# Patient Record
Sex: Female | Born: 1995 | Hispanic: No | Marital: Single | Smoking: Never smoker
Health system: Southern US, Community
[De-identification: ages and names within clinical notes are randomized; demographics above are authoritative.]

## PROBLEM LIST (undated history)

## (undated) DIAGNOSIS — Z8481 Family history of carrier of genetic disease: Secondary | ICD-10-CM

## (undated) DIAGNOSIS — Z8041 Family history of malignant neoplasm of ovary: Secondary | ICD-10-CM

## (undated) HISTORY — DX: Family history of malignant neoplasm of ovary: Z80.41

## (undated) HISTORY — DX: Family history of carrier of genetic disease: Z84.81

---
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
## 2014-02-05 ENCOUNTER — Other Ambulatory Visit: Payer: Self-pay

## 2015-11-29 ENCOUNTER — Ambulatory Visit: Payer: Medicaid Other

## 2015-12-09 ENCOUNTER — Ambulatory Visit: Payer: Self-pay | Attending: Internal Medicine

## 2020-04-21 ENCOUNTER — Other Ambulatory Visit: Payer: Self-pay

## 2020-04-21 ENCOUNTER — Other Ambulatory Visit (HOSPITAL_COMMUNITY)
Admission: RE | Admit: 2020-04-21 | Discharge: 2020-04-21 | Disposition: A | Discharge status: Home / Self Care | Payer: Medicaid Other | Source: Ambulatory Visit | Attending: Family Medicine | Admitting: Family Medicine

## 2020-04-21 ENCOUNTER — Encounter: Payer: Self-pay | Admitting: Family Medicine

## 2020-04-21 ENCOUNTER — Ambulatory Visit (INDEPENDENT_AMBULATORY_CARE_PROVIDER_SITE_OTHER): Payer: Self-pay | Admitting: Family Medicine

## 2020-04-21 VITALS — BP 104/70 | HR 97 | Ht 63.0 in | Wt 209.5 lb

## 2020-04-21 DIAGNOSIS — Z113 Encounter for screening for infections with a predominantly sexual mode of transmission: Secondary | ICD-10-CM | POA: Insufficient documentation

## 2020-04-21 DIAGNOSIS — Z114 Encounter for screening for human immunodeficiency virus [HIV]: Secondary | ICD-10-CM

## 2020-04-21 DIAGNOSIS — J309 Allergic rhinitis, unspecified: Secondary | ICD-10-CM | POA: Insufficient documentation

## 2020-04-21 DIAGNOSIS — Z1159 Encounter for screening for other viral diseases: Secondary | ICD-10-CM

## 2020-04-21 DIAGNOSIS — Z0000 Encounter for general adult medical examination without abnormal findings: Secondary | ICD-10-CM

## 2020-04-21 DIAGNOSIS — E669 Obesity, unspecified: Secondary | ICD-10-CM

## 2020-04-21 DIAGNOSIS — Z124 Encounter for screening for malignant neoplasm of cervix: Secondary | ICD-10-CM

## 2020-04-21 DIAGNOSIS — J3089 Other allergic rhinitis: Secondary | ICD-10-CM

## 2020-04-21 DIAGNOSIS — R41840 Attention and concentration deficit: Secondary | ICD-10-CM

## 2020-04-21 DIAGNOSIS — R946 Abnormal results of thyroid function studies: Secondary | ICD-10-CM

## 2020-04-21 HISTORY — DX: Allergic rhinitis, unspecified: J30.9

## 2020-04-21 LAB — POCT GLYCOSYLATED HEMOGLOBIN (HGB A1C): Hemoglobin A1C: 5.3 % (ref 4.0–5.6)

## 2020-04-21 MED ORDER — FLUTICASONE PROPIONATE 50 MCG/ACT NA SUSP: 2.0000 | Freq: Every day | NASAL | 6 refills | Status: DC

## 2020-04-21 NOTE — Progress Notes (Addendum)
CPT E&M Office Visit Time Before Visit; reviewing medical records (e.g. recent visits, labs, studies): 10 minutes During Visit (F2F time): 20 minutes After Visit (discussion with family or HCP, prescribing, ordering, referring, calling result/recommendations or documenting on same day): 15 minutes Total Visit Time: 45 minutes  Beverly Hunter is appeared alone Sources of clinical information for visit is/are patient and past medical records and labs. Nursing assessment for this office visit was reviewed with the patient for accuracy and revision.   Birth sex: Female Gender self-identification: Woman. Prefers She/Her pronouns. Bisexual orientation. Not currently in a sexual relationship. Not interested in contraception.   Previous Report(s) Reviewed: historical medical records  Depression screen [facility] 2/9 04/21/2020  Decreased Interest 1  Down, Depressed, Hopeless 1  PHQ - 2 Score 2  Altered sleeping 2  Tired, decreased energy 2  Change in appetite 3  Feeling bad or failure about yourself  1  Trouble concentrating 3  Moving slowly or fidgety/restless 1  Suicidal thoughts 0  PHQ-9 Score 14    No flowsheet data found.  PHQ9 SCORE ONLY 04/21/2020  PHQ-9 Total Score 14    Adult vaccines due  Topic Date Due  . TETANUS/TDAP  10/18/2020    Health Maintenance Due  Topic Date Due  . PAP-Cervical Cytology Screening  Never done  . PAP SMEAR-Modifier  Never done      History/P.E. limitations: none  Adult vaccines due  Topic Date Due  . TETANUS/TDAP  10/18/2020   There are no preventive care reminders to display for this patient.  Health Maintenance Due  Topic Date Due  . PAP-Cervical Cytology Screening  Never done  . PAP SMEAR-Modifier  Never done     Chief Complaint  Patient presents with  . Annual Exam  . ADHD  . Obesity  . Allergic Rhinitis     Re-establish care.  Last [facility] office visit in 2015, when she was a patient of Dr Arieon Corcoran.Sherrill's mother is a patient  of Dr Quaid Yeakle.   Visit Problem List with A/P  Poor concentration New problem Ms Marsico reports difficulty with completing tasks.  This difficulty may be related to problems in organizing tasks, attention to tasks, or remembering the tasks.  This problem is interfering with her occupational life.  Her only restless symptom is interrupting others during conversations.  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 Ms Sportsman feels that she had similar problems during college with her academic tasks.   Ms Shannon' PHQ-9 score was 14, with question nine with a non SI answer.  No report of substance abuse disorder.   Assessment 1) Complaint of attention/concentration difficulties that are interfering with occupational performance, and previously with academic performance.  While this could be Adult Attention Deficit Disorder, the elevated PHQ-9 could indicate either a primary mood difficulty or a co-morbid mood problem along with an attention disorder.  Given the complexity of the diagnosis, I recommend a referral to psychiatry for evaluation and treatment recommendations.  Ms Longsworth' is in agreement with this recommendation.   I ask that Ms Nawaz contact her health insurer to see which psychiatrist or psychiatric nurse practioners ar in her insurers' provider network, then arrange an appointment with one of them.  We will be glad to make a formal referral if it is required by her insurer.   Additionally, I recommend to Ms Meldrum to review the psychiatry providers registered  on the [employer] Today "Find a Therapist" database. She may want to look into to whom she would like to consult.   Finally, I sent thru MyChart the Adult ADHD Self-Report Scale, asking her to complete and return it to me.  We can use this in making a referral.     Obesity (BMI 35.0-39.9 without  comorbidity) Established problem worsened. Wt Readings from Last 3 Encounters:  04/21/20 209 lb 8 oz (95 kg)  04/27/13 161 lb (73 kg) (90 %, Z= 1.28)*  04/01/13 166 lb (75.3 kg) (92 %, Z= 1.40)*   Ms Cuthrell's BMI is 37% which is consistent with obesity.  She currently has a low activity level.  Her eating habits sound sporadic.  She wanted to know about pharmacological options for weight reduction.  We discussed the Over-the -Counter Alli (orlistat) in addition to healthy eating.  She is interested in nutrition counseling, so I gave her Dr Rhona Raider contact card, asking her to call Dr Gerilyn Pilgrim to discuss setting up a consultation.    Looking for possible cause and complications of obesity  Lab Results  Component Value Date   TSH 2.050 04/21/2020   Lab Results  Component Value Date   HGBA1C 5.3 04/21/2020   No evidence of thyroid dysfunction nor diabetes.      Allergic rhinitis Established problem Perennial allgery - worse with dust exposure Uncontrolled - disturbing post-nasal drip Start: Flonase Continue: OTC daily non-sedating antihistamines Request Ms Tuckett notify us if her symptom is not improving, or the therapy is not tolerable.    Healthcare maintenance GC/chlamydia urine screening done Screening HIV and HCV performed in low risk individual  Flu vacc given Ms Caver would prefer a female provider to perform her delinquent Pap smear and HPV testing. A referral was placed to [facility] for cervical cancer screening.

## 2020-04-21 NOTE — Patient Instructions (Addendum)
Consider calling Wyona Almas, PhD  RD for nutritional counseling.  If you wish to use an Alli (orlistat) a medication that blocks absorption of fat in your food.  It is available at most Drug Stores, Teacher, English as a foreign language. Take a Multi-Vitamin at Bedtime if you use Alli to make sure you get enough fat-soluble vitamins absorbed.   We are checking thyroid function, hepatitis C virus and HIV screening.  A one-time screening for these infections are universally recommended by the CDC. We are screening for gonorrhea and chlamydia with a urine test.  This screening is also recommended by the CDC in women age 54 to 41.   We will make a referral to the [facility] over near the old [facility] to have your Pap smear performed.   Once Dr Tenelle Andreason figures out which psychiatrist accept your insurance, we will make a referral to them for you.   Give a try of  Flonase nasal spray for at least a month to see if it helps.  If it helps, it is safe to take indefinitely.

## 2020-04-22 DIAGNOSIS — Z0000 Encounter for general adult medical examination without abnormal findings: Secondary | ICD-10-CM | POA: Insufficient documentation

## 2020-04-22 DIAGNOSIS — R41840 Attention and concentration deficit: Secondary | ICD-10-CM

## 2020-04-22 HISTORY — DX: Attention and concentration deficit: R41.840

## 2020-04-22 LAB — HEPATITIS C ANTIBODY: Hep C Virus Ab: <0.1 s/co ratio (ref 0.0–0.9)

## 2020-04-22 LAB — TSH: TSH: 2.05 u[IU]/mL (ref 0.450–4.500)

## 2020-04-22 LAB — HIV ANTIBODY (ROUTINE TESTING W REFLEX): HIV Screen 4th Generation wRfx: NONREACTIVE

## 2020-04-22 NOTE — Assessment & Plan Note (Addendum)
GC/chlamydia urine screening done Screening HIV and HCV performed in low risk individual  Flu vacc given Beverly Hunter Date would prefer a female provider to perform her delinquent Pap smear and HPV testing. A referral was placed to [facility] for [facility] for cervical cancer screening.

## 2020-04-22 NOTE — Assessment & Plan Note (Addendum)
New problem Ms Beverly Hunter reports difficulty with completing tasks.  This difficulty may be related to problems in organizing tasks, attention to tasks, or remembering the tasks.  This problem is interfering with her occupational life.  Her only restless symptom is interrupting others during conversations.  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 Ms Beverly Hunter feels that she had similar problems during college with her academic tasks.   Ms Beverly Hunter' PHQ-9 score was 14, with question nine with a non SI answer.  No report of substance abuse disorder.   Assessment 1) Complaint of attention/concentration difficulties that are interfering with occupational performance, and previously with academic performance.  While this could be Adult Attention Deficit Disorder, the elevated PHQ-9 could indicate either a primary mood difficulty or a co-morbid mood problem along with an attention disorder.  Given the complexity of the diagnosis, I recommend a referral to psychiatry for evaluation and treatment recommendations.  Ms Beverly Hunter' is in agreement with this recommendation.   I ask that Ms Beverly Hunter contact her health insurer to see which psychiatrist or psychiatric nurse practioners ar in her insurers' provider network, then arrange an appointment with one of them.  We will be glad to make a formal referral if it is required by her insurer.   Additionally, I recommend to Ms Beverly Hunter to review the psychiatry providers registered on the [employer] Today "Find a Therapist" database. She may want to look into to whom she would like to consult.   Finally, I sent thru MyChart the Adult ADHD Self-Report Scale, asking her to complete and return it to me.  We can use this in making a referral.

## 2020-04-22 NOTE — Addendum Note (Signed)
Addended byPerley Jain, Grover Robinson D on: 04/22/2020 12:24 PM   Modules accepted: Orders

## 2020-04-22 NOTE — Assessment & Plan Note (Addendum)
Established problem worsened. Wt Readings from Last 3 Encounters:  04/21/20 209 lb 8 oz (95 kg)  04/27/13 161 lb (73 kg) (90 %, Z= 1.28)*  04/01/13 166 lb (75.3 kg) (92 %, Z= 1.40)*   Beverly Hunter's BMI is 37% which is consistent with obesity.  She currently has a low activity level.  Her eating habits sound sporadic.  She wanted to know about pharmacological options for weight reduction.  We discussed the Over-the -Counter Alli (orlistat) in addition to healthy eating.  She is interested in nutrition counseling, so I gave her Dr Rhona Raider contact card, asking her to call Dr Gerilyn Pilgrim to discuss setting up a consultation.    Looking for possible cause and complications of obesity  Lab Results  Component Value Date   TSH 2.050 04/21/2020   Lab Results  Component Value Date   HGBA1C 5.3 04/21/2020   No evidence of thyroid dysfunction nor diabetes.

## 2020-04-22 NOTE — Assessment & Plan Note (Addendum)
Established problem Perennial allgery - worse with dust exposure Uncontrolled - disturbing post-nasal drip Start: Flonase Continue: OTC daily non-sedating antihistamines Request Ms Hasley notify us if her symptom is not improving, or the therapy is not tolerable.

## 2020-04-25 LAB — URINE CYTOLOGY ANCILLARY ONLY
Chlamydia: NEGATIVE
Comment: NEGATIVE
Comment: NORMAL
Neisseria Gonorrhea: NEGATIVE

## 2020-05-02 ENCOUNTER — Encounter: Payer: Self-pay | Admitting: Family Medicine

## 2020-06-08 ENCOUNTER — Encounter: Payer: Self-pay | Admitting: Nurse Practitioner

## 2020-06-08 ENCOUNTER — Ambulatory Visit (INDEPENDENT_AMBULATORY_CARE_PROVIDER_SITE_OTHER): Payer: Medicaid Other | Admitting: Nurse Practitioner

## 2020-06-08 ENCOUNTER — Other Ambulatory Visit (HOSPITAL_COMMUNITY)
Admission: RE | Admit: 2020-06-08 | Discharge: 2020-06-08 | Disposition: A | Discharge status: Home / Self Care | Payer: Medicaid Other | Source: Ambulatory Visit | Attending: Nurse Practitioner | Admitting: Nurse Practitioner

## 2020-06-08 ENCOUNTER — Other Ambulatory Visit: Payer: Self-pay

## 2020-06-08 VITALS — Wt 209.9 lb

## 2020-06-08 DIAGNOSIS — Z01419 Encounter for gynecological examination (general) (routine) without abnormal findings: Secondary | ICD-10-CM

## 2020-06-08 DIAGNOSIS — Z6837 Body mass index (BMI) 37.0-37.9, adult: Secondary | ICD-10-CM

## 2020-06-08 DIAGNOSIS — Z124 Encounter for screening for malignant neoplasm of cervix: Secondary | ICD-10-CM | POA: Diagnosis not present

## 2020-06-08 NOTE — Progress Notes (Signed)
GYNECOLOGY ANNUAL PREVENTATIVE CARE ENCOUNTER NOTE  Subjective:   Beverly Hunter is a 25 y.o. No obstetric history on file. female here for a routine annual gynecologic exam.  Current complaints: wants pap and it will be her first pap smear.   Denies abnormal vaginal bleeding, discharge, pelvic pain, problems with intercourse or other gynecologic concerns. Has PCP she sees for other medical problems but she wanted a female provider for her pap smear.   Gynecologic History Patient's last menstrual period was 06/05/2020 (exact date). Contraception: abstinence Last Pap: NA  Obstetric History OB History  No obstetric history on file.    No past medical history on file.  No past surgical history on file.  Current Outpatient Medications on File Prior to Visit  Medication Sig Dispense Refill  . FLUoxetine (PROZAC) 20 MG tablet Take 20 mg by mouth daily.    . fluticasone (FLONASE) 50 MCG/ACT nasal spray Place 2 sprays into both nostrils daily. 16 g 6  . levocetirizine (XYZAL) 5 MG tablet Take 5 mg by mouth every evening.    . Multiple Vitamin (MULTIVITAMIN WITH MINERALS) TABS tablet Take 1 tablet by mouth daily.    Marland Kitchen orlistat (ALLI) 60 MG capsule Take 60 mg by mouth 3 (three) times daily with meals.     No current facility-administered medications on file prior to visit.    No Known Allergies  Social History   Socioeconomic History  . Marital status: Single    Spouse name: Not on file  . Number of children: Not on file  . Years of education: Not on file  . Highest education level: Not on file  Occupational History  . Not on file  Tobacco Use  . Smoking status: Never Smoker  . Smokeless tobacco: Never Used  Substance and Sexual Activity  . Alcohol use: Never  . Drug use: Never  . Sexual activity: Not Currently  Other Topics Concern  . Not on file  Social History Narrative  . Not on file   Social Determinants of Health   Financial Resource Strain: Not on file   Food Insecurity: Not on file  Transportation Needs: Not on file  Physical Activity: Not on file  Stress: Not on file  Social Connections: Not on file  Intimate Partner Violence: Not on file    No family history on file.  The following portions of the patient's history were reviewed and updated as appropriate: allergies, current medications, past family history, past medical history, past social history, past surgical history and problem list.  Review of Systems Pertinent items noted in HPI and remainder of comprehensive ROS otherwise negative.   Objective:  Wt 209 lb 14.4 oz (95.2 kg)   LMP 06/05/2020 (Exact Date)   BMI 37.18 kg/m  BP not recorded at the time of this note.  Was checked today by CMA and client reports she was told it was normal. CONSTITUTIONAL: Well-developed, well-nourished female in no acute distress.  HENT:  Normocephalic, atraumatic, External right and left ear normal.  EYES: Conjunctivae and EOM are normal. Pupils are equal, round.  No scleral icterus.  NECK: Normal range of motion, supple, no masses.  Normal thyroid.  SKIN: Skin is warm and dry. No rash noted. Not diaphoretic. No erythema. No pallor. NEUROLOGIC: Alert and oriented to person, place, and time. Normal reflexes, muscle tone coordination. No cranial nerve deficit noted. PSYCHIATRIC: Normal mood and affect. Normal behavior. Normal judgment and thought content. CARDIOVASCULAR: Normal heart rate noted, regular rhythm RESPIRATORY:  Clear to auscultation bilaterally. Effort and breath sounds normal, no problems with respiration noted. BREASTS: Symmetric in size. No masses, skin changes, nipple drainage, or lymphadenopathy. ABDOMEN: Soft, no distention noted.  No tenderness, rebound or guarding.  PELVIC: Normal appearing external genitalia; normal appearing vaginal mucosa and cervix.  No abnormal discharge noted.  Pap smear obtained.  Bimanual deferred MUSCULOSKELETAL: Normal range of motion. No  tenderness.  No cyanosis, clubbing, or edema.    Assessment and Plan:  1. Annual GYN exam nonsmoker Advised weight loss to BMI less than 25 Advised to return as needed for contraception  - Cytology - PAP( [facility])  Will follow up results of pap smear and manage accordingly. Routine preventative health maintenance measures emphasized. Please refer to After Visit Summary for other counseling recommendations.    Nolene Bernheim, RN, MSN, NP-BC Nurse Practitioner, [facility]

## 2020-06-09 ENCOUNTER — Ambulatory Visit (INDEPENDENT_AMBULATORY_CARE_PROVIDER_SITE_OTHER): Payer: Medicaid Other | Admitting: Family Medicine

## 2020-06-09 DIAGNOSIS — E669 Obesity, unspecified: Secondary | ICD-10-CM

## 2020-06-09 DIAGNOSIS — Z713 Dietary counseling and surveillance: Secondary | ICD-10-CM

## 2020-06-09 NOTE — Patient Instructions (Addendum)
You may want to experiment with adding seltzer or club soda to sweet drinks.  Over time, you can increase the proportion of seltzer to sweet drink, and taste preferences will change.    Goals:  1. Walk at least 30 minutes 3 times a week.       - Call [facility] to ask if she wants to walk also.      - Determine some safe place to walk.       - Set some consistent times to walk each week.       - Initially, schedule your walk times on your calendar.     - Place your walking shoes by the door the morning of walking days, and pour a water bottle for after your walk.    2. Eat at least 3 REAL meals and 1-2 snacks per day.  Eat breakfast within one hour of getting up.  Aim for no more than 5 hours between eating.   A REAL meal for lunch and dinner includes at least some protein, some starch, and vegetables and/or fruit.   (OR: Would you serve this to a guest in your home, and call it a meal?)      - For both lunch and dinner, aim for twice the volume of vegetables as either protein or starch foods.       - On the weekend, determine what groceries you need for the week, so you have foods on hand to make meals you want.       - Make lunches the night before, which could be just dinner leftovers.       - Make good use of frozen vegetables for a quick option.       - Try roasted vegetables.    Document progress on your goals using a chart you design or a notebook, or just marking on a calendar.      Follow-up video apt on Thursday, March 17 at 1:30 PM.

## 2020-06-09 NOTE — Progress Notes (Signed)
Telehealth Encounter I connected with Beverly Hunter (MRN [id number]) on 06/09/2020 by MyChart video-enabled, HIPAA-compliant telemedicine application, verified that I was speaking with the correct person using two identifiers, and that the patient was in a private environment conducive to confidentiality.  The patient agreed to proceed.  Persons participating in visit were patient and provider (registered dietitian) Linna Darner, PhD, RD, LDN, CEDRD.  Provider was located at [facility] during this telehealth encounter; patient was at home.  Appt start time: 1530 end time: 1630 (1 hour)  Reason for telehealth visit: Referred by Acquanetta Belling, MD for Medical Nutrition Therapy related to weight management.  Relevant history/background: Beverly Hunter said she doesn't eat much, but still gains weight easily.  When she restricted to 1200 kcal/day, she lost weight, but regained during the pandemic once she stopped going to the gym, and was eating more while home.    Assessment: Beverly Hunter lives with her mom with whom she sometimes shares meals.  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   Usual eating pattern: 2 meals and 0-1 snack per day. Frequent foods and beverages: 24-36 oz Sprite/day, few sips fruit punch/day, and tries to drink 16 oz water/day); eggs, toast, bacon, rice and veg's, chx, beef, peanut butter.   Avoided foods: spicy foods, certain textures (e.g., berries, tomatoes), seafood, fish.   Usual physical activity: None currently.  Cancelled gym membership b/c of COVID. Sleep: Estimates average of 6-7 hours of sleep/night.  Feels more energetic since starting fluoxetine this month.   24-hr recall:  (Up at 10 AM; had ~6 oz fruit punch with med's) B (11:30 AM)-  2 scrmbld eggs, 2 toast, 2 tsp margarine, 2 slc bacon, 6 oz fruit punch Snk ( AM)-  --- L ( PM)-  --- Snk ( PM)-   10 oz Hi-C  D (11 PM)-  1 slc chs pizza, 12 oz Sprite Snk ( PM)-  --- Typical day? No.  Rarely has pizza, and usually has fewer sweet drinks.  Dinner is more likely to be rice and veg's with chx or beef or frozen pasta meals.    Intervention: Completed diet and exercise history, and established behavioral goals.   For recommendations and goals, see Patient Instructions.    Follow-up: 4 weeks.   Beverly Hunter,[name]

## 2020-06-13 LAB — CYTOLOGY - PAP: Diagnosis: NEGATIVE

## 2020-07-07 ENCOUNTER — Ambulatory Visit (INDEPENDENT_AMBULATORY_CARE_PROVIDER_SITE_OTHER): Payer: 59 | Admitting: Family Medicine

## 2020-07-07 ENCOUNTER — Other Ambulatory Visit: Payer: Self-pay

## 2020-07-07 DIAGNOSIS — E669 Obesity, unspecified: Secondary | ICD-10-CM | POA: Diagnosis not present

## 2020-07-07 NOTE — Patient Instructions (Signed)
Snacks:  Always plate your food.  This gives you a natural stopping point.   Other snack options:  Unsweetened apple sauce, apples, grapes, oranges, string cheese, peanut butter on apple, any nuts/seeds (limit to <1/2 cup; most nuts/seeds provide 800 calories per cup).  Work on expanding the repertoire of vegetables that you like to help ensure that you don't get tired of vegetables.  Make three lists:  1. Vegetables you like and eat now 2. Vegetables you won't even consider 3. Vegetables you might consider trying if they are prepared a certain way Continue to eat vegetables you currently eat, but from list #3, choose a vegetable to try at least 3 times a week.  Use small amounts of this vegetable, cut small, combined with foods or seasonings you like.    Goals remain the same:  1. Walk at least 30 minutes 3 times a week.      - Consider ways in which you can help ensure your walking happens, e.g., have you scheduled your walking for intended days?  2. Eat at least 3 REAL meals and 1-2 snacks per day.  Eat breakfast within one hour of getting up.  Aim for no more than 5 hours between eating.   A REAL meal for lunch and dinner includes at least some protein, some starch, and vegetables and/or fruit.   (OR: Would you serve this to a guest in your home, and call it a meal?)      - For both lunch and dinner, aim for twice the volume of vegetables as either protein or starch foods.       - On the weekend, determine what groceries you need for the week, so you have foods on hand to make meals you want.       - Make lunches the night before, which could be just dinner leftovers.       - Make good use of frozen vegetables for a quick option.       - Try roasted vegetables.  An Internet search will provide simple instructions.    Document progress on your goals using the Goals Sheet provided today.   Follow-up video appt on Tuesday, May 17 at 2:30 PM.

## 2020-07-07 NOTE — Progress Notes (Signed)
Telehealth Encounter PCP Acquanetta Belling, MD I connected with Ilham Roughton (MRN [id number]) on 07/07/2020 by MyChart video-enabled, HIPAA-compliant telemedicine application, verified that I was speaking with the correct person using two identifiers, and that the patient was in a private environment conducive to confidentiality.  The patient agreed to proceed.  Persons participating in visit were patient and provider (registered dietitian) Linna Darner, PhD, RD, LDN, CEDRD.  Provider was located at [facility] during this telehealth encounter; patient was at home.  Appt start time: 1530 end time: 1630 (1 hour)  Reason for telehealth visit: Referred by Acquanetta Belling, MD for Medical Nutrition Therapy related to weight management.  Relevant history/background: Xaniyah said she doesn't eat much, but still gains weight easily.  When she restricted to 1200 kcal/day, she lost weight, but regained during the pandemic once she stopped going to the gym, and was eating more while home.    Assessment: Dawanna has been preparing foods for the day first thing in the morning.  She has eliminated soda and juice, but was unaware that the sparkling water she is using is artificially sweetened.   Recent eating pattern: 2-3 meals and 0-1 snack per day. Usual physical activity: Walking 45-50 minutes twice a week.     24-hr recall:  (Up at 9 AM) B (10 AM)-  2 eggs, 2 toast, 2 bacon, diet sparkling water Snk ( AM)-   L (3 PM)-  1 [place] (1.5 oz) sandwich, 1 slc provolone, 1(+) c zucc&squash, diet sparkling water Snk (5 PM)-  ~20 veggie straws   D (10 PM)-  1 chx breast, 2 c broccoli, diet sparkling water Snk ( PM)-  1/2 c sunflower seeds, water Typical day? Yes.   More usual snacks are pretzels.   Intervention: Reviewed progress on behavioral goals established on 06/09/20, and established a Goals Sheet for documenting progress.  Discussed importance of expanding taste preferences for  greater variety of veg's.    For recommendations and goals, see Patient Instructions.    Follow-up: Video visit in 9 weeks.   Krisha Beegle,[name]

## 2020-08-11 ENCOUNTER — Other Ambulatory Visit: Payer: Self-pay | Admitting: Family Medicine

## 2020-08-11 DIAGNOSIS — Z8041 Family history of malignant neoplasm of ovary: Secondary | ICD-10-CM | POA: Insufficient documentation

## 2020-08-12 ENCOUNTER — Other Ambulatory Visit: Payer: Self-pay

## 2020-08-12 ENCOUNTER — Telehealth: Payer: Self-pay | Admitting: Licensed Clinical Social Worker

## 2020-08-12 ENCOUNTER — Ambulatory Visit (INDEPENDENT_AMBULATORY_CARE_PROVIDER_SITE_OTHER): Payer: 59

## 2020-08-12 DIAGNOSIS — Z23 Encounter for immunization: Secondary | ICD-10-CM

## 2020-08-12 NOTE — Telephone Encounter (Signed)
Received a genetic counseling referral from Dr. McDiarmid for a fhx of ovarian cancer. Beverly Hunter has been cld and scheduled to see Beverly Hunter along w/her mom Beverly Hunter on 5/2 at 2pm. Aware to rrive 15 minutes early.

## 2020-08-15 ENCOUNTER — Ambulatory Visit: Payer: 59

## 2020-08-15 NOTE — Progress Notes (Signed)
Patient presents to nurse clinic for updated vaccinations. Administered HPV vaccine in LD, site unremarkable, tolerated injection well.   Veronda Prude, RN

## 2020-08-22 ENCOUNTER — Other Ambulatory Visit: Payer: Self-pay

## 2020-08-22 ENCOUNTER — Inpatient Hospital Stay: Payer: 59

## 2020-08-22 ENCOUNTER — Inpatient Hospital Stay: Payer: 59 | Attending: Family Medicine | Admitting: Licensed Clinical Social Worker

## 2020-08-22 DIAGNOSIS — Z8481 Family history of carrier of genetic disease: Secondary | ICD-10-CM

## 2020-08-22 DIAGNOSIS — Z8041 Family history of malignant neoplasm of ovary: Secondary | ICD-10-CM | POA: Diagnosis not present

## 2020-08-22 LAB — GENETIC SCREENING ORDER

## 2020-08-23 ENCOUNTER — Encounter: Payer: Self-pay | Admitting: Licensed Clinical Social Worker

## 2020-08-23 DIAGNOSIS — Z8481 Family history of carrier of genetic disease: Secondary | ICD-10-CM | POA: Insufficient documentation

## 2020-08-23 NOTE — Progress Notes (Signed)
REFERRING PROVIDER: McDiarmid, Blane Ohara, MD [place],  [place] [address]  PRIMARY PROVIDER:  McDiarmid, Blane Ohara, MD  PRIMARY REASON FOR VISIT:  1. Family history of ovarian cancer   2. Family history of gene mutation      HISTORY OF PRESENT ILLNESS:   Beverly Hunter, a 25 y.o. female, was seen for a [place] cancer genetics consultation at the request of Dr. McDiarmid due to a family history of ovarian cancer and known mutation in [place] in the family.  Beverly Hunter presents to clinic today to discuss the possibility of a hereditary predisposition to cancer, genetic testing, and to further clarify her future cancer risks, as well as potential cancer risks for family members.   Beverly Hunter is a 25 y.o. female with no personal history of cancer.    CANCER HISTORY:  Oncology History   No history exists.     RISK FACTORS:  Menarche was at age 59.  OCP use for approximately 0 years.  Ovaries intact: yes.  Hysterectomy: no.  Menopausal status: premenopausal.  HRT use: 0 years. Colonoscopy: no; not examined. Mammogram within the last year: no. Number of breast biopsies: 0. Up to date with pelvic exams: yes. Any excessive radiation exposure in the past: no  Past Medical History:  Diagnosis Date  . Family history of gene mutation   . Family history of ovarian cancer     No past surgical history on file.  Social History   Socioeconomic History  . Marital status: Single    Spouse name: Not on file  . Number of children: Not on file  . Years of education: Not on file  . Highest education level: Not on file  Occupational History  . Not on file  Tobacco Use  . Smoking status: Never Smoker  . Smokeless tobacco: Never Used  Substance and Sexual Activity  . Alcohol use: Never  . Drug use: Never  . Sexual activity: Not Currently  Other Topics Concern  . Not on file  Social History Narrative  . Not on file   Social Determinants of Health   Financial Resource  Strain: Not on file  Food Insecurity: Not on file  Transportation Needs: Not on file  Physical Activity: Not on file  Stress: Not on file  Social Connections: Not on file     FAMILY HISTORY:  We obtained a detailed, 4-generation family history.  Significant diagnoses are listed below: Family History  Problem Relation Age of Onset  . Other Maternal Aunt        BRIP1+  . Ovarian cancer Half-Sister 25  . Cancer Cousin        vulvar; BRIP1+   Beverly Hunter has 1 full brother, 1 maternal half brother, 1 maternal half sister and several paternal half siblings. Her maternal half sister was diagnosed with ovarian cancer and passed at 1.   Beverly Hunter mother is living at 15 with no history of cancer. Patient had 3 maternal uncles and 1 maternal aunt. Her aunt, Inez Catalina, recently tested positive for a BRIP1 mutation, as did her daughter who had history of vulvar and breast cancer. Maternal grandmother is living at 29. Grandfather died at 39 and one of his sister's daughters had ovarian cancer and died at 38.   Beverly Hunter has limited information about her paternal side of the family.  There are no cancers on this side that she is aware of.   Beverly Hunter is aware of previous family history  of genetic testing for hereditary cancer risks. Patient's maternal ancestors are of European descent, and paternal ancestors are of African descent (per 23 and me). There is no reported Ashkenazi Jewish ancestry. There is no known consanguinity.    GENETIC COUNSELING ASSESSMENT: Ms. Dimalanta is a 25 y.o. female with a family history of ovarian, breast cancer and a known BRIP1 mutation in the family. We, therefore, discussed and recommended the following at today's visit.   DISCUSSION: We discussed that approximately 5-10% of cancer is hereditary. We discussed the BRIP1 gene in particular, noting that is is associated with ovarian cancer, although there are other genes associated with hereditary cancer as well that we could  test since she has limited information about her paternal side. We discussed that testing is beneficial for several reasons including knowing about other cancer risks, identifying potential screening and risk-reduction options that may be appropriate, and to understand if other family members could be at risk for cancer and allow them to undergo genetic testing.   We reviewed the characteristics, features and inheritance patterns of hereditary cancer syndromes. We also discussed genetic testing, including the appropriate family members to test, the process of testing, insurance coverage and turn-around-time for results. We discussed the implications of a negative, positive and/or variant of uncertain significant result. We recommended Beverly Hunter pursue genetic testing for the [employer] CancerNext-Expanded+RNA gene panel.   The CancerNext-Expanded + RNAinsight gene panel offered by [employer] and includes sequencing and rearrangement analysis for the following 77 genes: IP, ALK, APC*, ATM*, AXIN2, BAP1, BARD1, BLM, BMPR1A, BRCA1*, BRCA2*, BRIP1*, CDC73, CDH1*,CDK4, CDKN1B, CDKN2A, CHEK2*, CTNNA1, DICER1, FANCC, FH, FLCN, GALNT12, KIF1B, LZTR1, MAX, MEN1, MET, MLH1*, MSH2*, MSH3, MSH6*, MUTYH*, NBN, NF1*, NF2, NTHL1, PALB2*, PHOX2B, PMS2*, POT1, PRKAR1A, PTCH1, PTEN*, RAD51C*, RAD51D*,RB1, RECQL, RET, SDHA, SDHAF2, SDHB, SDHC, SDHD, SMAD4, SMARCA4, SMARCB1, SMARCE1, STK11, SUFU, TMEM127, TP53*,TSC1, TSC2, VHL and XRCC2 (sequencing and deletion/duplication); EGFR, EGLN1, HOXB13, KIT, MITF, PDGFRA, POLD1 and POLE (sequencing only); EPCAM and GREM1 (deletion/duplication only).  Based on Beverly Hunter's family history of cancer and BRIP1 mutation, she meets medical criteria for genetic testing. Despite that she meets criteria, she may still have an out of pocket cost. We discussed that if her out of pocket cost for testing is over $100, the laboratory will call and confirm whether she wants to proceed with testing.  If  the out of pocket cost of testing is less than $100 she will be billed by the genetic testing laboratory.   PLAN: After considering the risks, benefits, and limitations, Beverly Hunter provided informed consent to pursue genetic testing and the blood sample was sent to [facility] for analysis of the CancerNext-Expanded+RNA panel. Results should be available within approximately 2-3 weeks' time, at which point they will be disclosed by telephone to Beverly Hunter, as will any additional recommendations warranted by these results. Beverly Hunter will receive a summary of her genetic counseling visit and a copy of her results once available. This information will also be available in Epic.   Beverly Hunter questions were answered to her satisfaction today. Our contact information was provided should additional questions or concerns arise. Thank you for the referral and allowing [place] to share in the care of your patient.   Faith Rogue, MS, [facility] Genetic Counselor [email or web address] Phone: [phone]  The patient was seen for a total of 35 minutes in face-to-face genetic counseling. Patient's mother Beverly Hunter was also present.  Dr. Grayland Ormond was available for discussion regarding this case.   _______________________________________________________________________  For Office Staff:  Number of people involved in session: 2 Was an Intern/ student involved with case: no

## 2020-09-06 ENCOUNTER — Other Ambulatory Visit: Payer: Self-pay

## 2020-09-06 ENCOUNTER — Ambulatory Visit (INDEPENDENT_AMBULATORY_CARE_PROVIDER_SITE_OTHER): Payer: 59 | Admitting: Family Medicine

## 2020-09-06 DIAGNOSIS — E669 Obesity, unspecified: Secondary | ICD-10-CM | POA: Diagnosis not present

## 2020-09-06 DIAGNOSIS — Z713 Dietary counseling and surveillance: Secondary | ICD-10-CM | POA: Diagnosis not present

## 2020-09-06 NOTE — Patient Instructions (Addendum)
Reminder:  With restaurant or takeout foods, it doesn't have to be all or none - you can add a veg and/or fruit to take-out.    Continue to work on expanding the vareity of vegetables you eat.    Goals:  1. Walk at least 30 minutes 3 times a week plus at least 15 minutes of home exercise (dumbbells and bands) 2 X week.      - Schedule your walking for intended days.  When possible, plan on early morning exercise.  Most people find it easier to follow through with exercise when it happens on consistent days and times.    2.Eat at least 3 REAL meals and 1-2 snacks per day. Eat breakfast within one hour of getting up. Aim for no more than 5 hours between eating.  A REAL mealfor lunch and dinnerincludes at least some protein, some starch, and vegetables and/or fruit.  (OR: Would you serve this to a guest in your home, and call it a meal?) - For both lunch and dinner, aim for twice the volume of vegetables as either protein or starch foods.  - Make lunches the night before, which could be just dinner leftovers.  - Make good use of frozen vegetables for a quick option.  - Try roasted vegetables. An Internet search will provide simple instructions.   3. Take a few minutes on the weekend to plan for the week ahead (both meals & exercise).    Document progress on the abovegoals using your Goals Sheet.   To help you change your taste preference for sweet drinks, try mixing any sweet drink with seltzer or club soda.    Follow-up video appt on Tuesday, June 7 at 2 PM.

## 2020-09-06 NOTE — Progress Notes (Signed)
Telehealth Encounter PCP Acquanetta Belling, MD I connected with Dashauna Heymann (MRN [id number]) on 09/06/2020 by MyChart video-enabled, HIPAA-compliant telemedicine application, verified that I was speaking with the correct person using two identifiers, and that the patient was in a private environment conducive to confidentiality.  The patient agreed to proceed.  Persons participating in visit were patient and provider (registered dietitian) Linna Darner, PhD, RD, LDN, CEDRD.  Provider was located at [facility] during this telehealth encounter; patient was at home.  Appt start time: 1430 end time: 1530 (1 hour)  Reason for telehealth visit: Referred by Acquanetta Belling, MD for Medical Nutrition Therapy related to weight management.  Relevant history/background: Phynix said she doesn't eat much, but still gains weight easily.  When she restricted to 1200 kcal/day, she lost weight, but regained during the pandemic once she stopped going to the gym, and was eating more while home.    Assessment: Deandra is eating more veg's, and she has tried some new veg's (asparagus, microwaved spinach).  She feels her biggest challenge is consistency in eating - getting her 3 meals and veg's as well as consistency in exercise.  She said tracking progress on the Goals Sheet has been helpful.   Weigt today: 193 lb (self-rept), down from 215 lb in Feb 2022.   Recent eating pattern: 2-3 meals and 1 snack per day. Usual physical activity: Working out ~15 at home with DBs and bands.  Walking 45 min just 1-2 X wk.     24-hr recall:  (Up at 4 AM) B (4:45 AM)-  2 fried eggs, 1 pancake, 2 tsp diet syrup, sug-free [place]  Snk ( AM)-  --- L (12 PM)-  [place] egg biscuit, 2 c fries, 12 oz orange juice Snk ( PM)-  --- D ( PM)-  1 chx brst, 2 c broc & cauliflr, sug-free [place]  Snk ( PM)-  --- Typical day? Yes.  Has had to eat on the go a lot recently.    Intervention: Reviewed progress on  behavioral goals established on 06/09/20, and established a Goals Sheet for documenting progress.  Discussed importance of expanding taste preferences for greater variety of veg's.    For recommendations and goals, see Patient Instructions.    Follow-up: Video visit in 4 weeks.   Donnie Gedeon,[name]

## 2020-09-15 ENCOUNTER — Telehealth: Payer: Self-pay | Admitting: Licensed Clinical Social Worker

## 2020-09-15 ENCOUNTER — Encounter: Payer: Self-pay | Admitting: Licensed Clinical Social Worker

## 2020-09-15 ENCOUNTER — Ambulatory Visit: Payer: Self-pay | Admitting: Licensed Clinical Social Worker

## 2020-09-15 DIAGNOSIS — Z1379 Encounter for other screening for genetic and chromosomal anomalies: Secondary | ICD-10-CM

## 2020-09-15 DIAGNOSIS — Z8481 Family history of carrier of genetic disease: Secondary | ICD-10-CM

## 2020-09-15 DIAGNOSIS — Z1589 Genetic susceptibility to other disease: Secondary | ICD-10-CM

## 2020-09-15 DIAGNOSIS — Z1501 Genetic susceptibility to malignant neoplasm of breast: Secondary | ICD-10-CM

## 2020-09-15 DIAGNOSIS — Z8041 Family history of malignant neoplasm of ovary: Secondary | ICD-10-CM

## 2020-09-15 HISTORY — DX: Genetic susceptibility to malignant neoplasm of breast: Z15.89

## 2020-09-15 NOTE — Telephone Encounter (Signed)
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 We discussed this result in detail.

## 2020-09-15 NOTE — Progress Notes (Signed)
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 Please refer to our prior cancer genetics clinic note for more information regarding our discussion, assessment and recommendations, at the time. Beverly Hunter recent genetic test results were disclosed to her, as were recommendations warranted by these results. These results and recommendations are discussed in more detail below.  CANCER HISTORY:  Oncology History   No history exists.    FAMILY HISTORY:  We obtained a detailed, 4-generation family history.  Significant diagnoses are listed below: Family History  Problem Relation Age of Onset  . Other Maternal Aunt        BRIP1+  . Ovarian cancer Half-Sister 28  . Cancer Cousin        vulvar; BRIP1+   Beverly Hunter has 1 full brother, 1 maternal half brother, 1 maternal half sister and several paternal half siblings. Her maternal half sister was diagnosed with ovarian cancer and passed at 21.   Beverly Hunter mother is living at 24 with no history of cancer. Patient had 3 maternal uncles and 1 maternal aunt. Her aunt, Inez Hunter, recently tested positive for a BRIP1 mutation, as did her daughter who had history of vulvar and breast cancer. Maternal grandmother is living at 32. Grandfather died at 37 and one of his sister's daughters had ovarian cancer and died at 40.   Beverly Hunter has limited information about her paternal side of the family.  There are no cancers on this side that she is aware of.   Beverly Hunter is aware of previous family history of genetic testing for hereditary cancer risks. Patient's maternal ancestors are of European descent, and paternal ancestors are of African descent (per 23 and me). There is no reported Ashkenazi Jewish ancestry. There is no known consanguinity.     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 The remainder of testing was negative/normal.   The CancerNext-Expanded + RNAinsight gene panel offered by [employer] and includes sequencing and rearrangement analysis for the following 77 genes: IP, ALK, APC*, ATM*, AXIN2, BAP1, BARD1, BLM, BMPR1A, BRCA1*, BRCA2*, BRIP1*, CDC73, CDH1*,CDK4, CDKN1B, CDKN2A, CHEK2*, CTNNA1, DICER1, FANCC, FH, FLCN, GALNT12, KIF1B, LZTR1, MAX, MEN1, MET, MLH1*, MSH2*, MSH3, MSH6*, MUTYH*, NBN, NF1*, NF2, NTHL1, PALB2*, PHOX2B, PMS2*, POT1, PRKAR1A, PTCH1, PTEN*, RAD51C*, RAD51D*,RB1, RECQL, RET, SDHA, SDHAF2, SDHB, SDHC, SDHD, SMAD4, SMARCA4, SMARCB1, SMARCE1, STK11, SUFU, TMEM127, TP53*,TSC1, TSC2, VHL and XRCC2 (sequencing and deletion/duplication); EGFR, EGLN1, HOXB13, KIT, MITF, PDGFRA, POLD1 and POLE (sequencing only); EPCAM and GREM1 (deletion/duplication only).  The test report has been scanned into EPIC and is located under the Molecular Pathology section of the Results Review tab.  A portion of the result report is included below for reference.     We discussed with Beverly Hunter that because current genetic testing is not perfect, it is possible there may be a gene mutation in one of these genes that current testing cannot detect, but that chance is small.  We also discussed, that there could be another gene that has not yet been discovered, or that we have not yet tested, that is responsible for the cancer diagnoses in the family. It is also possible there is a hereditary cause for the cancer in the family that Beverly Hunter did not inherit  and therefore was not identified in her testing.  Therefore, it is important to remain in touch with cancer genetics in the future so that we can continue to offer Beverly Hunter the most up to date genetic testing.   BRIP1 CANCER RISKS & RECOMMENDATIONS: We discussed that women who are carriers of a  single pathogenic variant in theBRIP1genehave an increased risk of ovarian cancer, and possibly breast cancer. The risk for ovarian cancer in these women is estimated to be up to 9.1% (compared to the general population ovarian cancer risk of approximately 1.3%).There is preliminary evidence supporting a correlation withBRIP1and predisposition tobreastcancer; however, the available evidence is insufficient to make a determination regarding thisrelationship.An individual with a BRIP1 pathogenic variant will not necessarily develop cancer in their lifetime, but the risk for cancer is increased over the general population risk.  Based on current understanding, men don't appear to have increased cancer risks due to BRIP1 mutations. However, men can carry the mutation and pass it on to their children.Additionally,informationregarding female cancer riskcould change as more research is done to understand BRIP97mtations.  Ovarian Cancer Management:The National Comprehensive Cancer Network(NCCN) recommends consideration of prophylactic salpingo-oophorectomy (surgical removal of the ovaries and fallopian tubes) for women with a pathogenic variant in BRIP1 after childbearing is complete. The current evidence is insufficient to make a firm recommendation as to the optimal age for this procedure. However, based on the current, limited evidence base, a discussion about surgery should be held around 445532years of age or earlier based on a specific family history of early-onset ovarian cancer (NArtist Genetic/Familial High-Risk Assessment: Breast,Ovarian, and Pancreatic;Version 2.2022). Women electing to defer prophylactic oophorectomy can consider screening with serum CA-125 and transvaginal ultrasound; however, data do not support such screening and it should not be a substitute for preventive surgery.  Ms. EFileplans to discuss this information further with her PCP  and plans to establish care with a GYN provider.  Breast Cancer Management:The current NCCN guidelines do not recommend additional breast cancer screening for individuals with a single pathogenic BRIP1 variant beyond what is recommended for the general population. However, they caution that cancer screening should ultimately be guided by personal and family history (NArtist Genetic/Familial High-Risk Assessment: Breast,Ovarian, and Pancreatic;Version 2.2022).  An individual's cancer risk and medical management are not determined by genetic test results alone. Overall cancer risk assessment incorporates additional factors, including personal medical history, family history, and any available genetic information that may result in a personalized plan for cancer prevention and surveillance.  Inheritance & Risk for Family Members:Hereditary predisposition to cancer due to a single pathogenic variant in the BRIP1 gene has autosomal dominant inheritance. This means that an individual with a pathogenic variant has a 50% (1 in 2) chance of passing the condition on to their offspring. Once such a variant is detected in an individual, it is possible to identify at-risk relatives who can pursue testing for this specific familial variant.   It is important that all of Beverly Hunter's relatives (both men and women) know of the presence of this gene mutation. Genetic testing can sort out who in the family is at risk and who is not. Beverly Hunter each have a 50% (1 in 2) chance to have inherited the mutation found in her. We recommend they have genetic testing for this same mutation, as identifying the presence of this mutation would allow them to also take advantage of risk-reducing measures.  We would be happy to  help meet with and coordinate genetic testing for any relative that is interested. To locate genetic counselors in other cities, Ms. Boise or other family members  can visit the website '[email or web address]' and search for a cancer genetic counselor by zip code.  Additionally, individuals with a pathogenic variant in BRIP1 are carriers of Fanconi anemia type J. Fanconi anemia is an autosomal recessive disorder that is characterized by bone marrow failure and variable presentation of anomalies, including short stature, abnormal skin pigmentation, abnormal thumbs, malformations of the skeletal and central nervous systems, and developmental delay (PMID: 2081388, 71959747). Risks for leukemia and early onset solid tumors are significantly elevated (PMID: 18550158, 68257493, 55217471). For there to be a risk of Fanconi anemia in offspring, both parents would each have to carry a pathogenic variant in [place]; in such a case, the risk of having an affected child is 25%.  SUPPORT AND RESOURCES:  If Ms. Urquidi is interested in information and support, there are two groups, Facing Our Risk (www.facingourrisk.com) and Bright Pink (www.brightpink.org) which some people have found useful when identified with a hereditary risk for ovarian cancer. They provide opportunities to speak with other individuals from high-risk families.  Lastly, we discussed that our knowledge of cancer risks related toBRIP94mtations will continue to evolve. We encouraged Ms. Treat to remain in contact with uKoreaon an annual basis so we can update her personal and family histories, and let her know of advances in cancer genetics that may benefit the family. Our contact number was provided. Ms. EWisenbakerquestions were answered to her satisfaction today, and she knows she is welcome to call anytime with additional questions.   BFaith Rogue MS, LMercy Rehabilitation Hospital Oklahoma CityGenetic Counselor [email or web address] Phone: [phone]

## 2020-09-21 ENCOUNTER — Emergency Department (HOSPITAL_COMMUNITY): Payer: 59

## 2020-09-21 ENCOUNTER — Emergency Department (HOSPITAL_COMMUNITY)
Admission: EM | Admit: 2020-09-21 | Discharge: 2020-09-21 | Disposition: A | Discharge status: Home / Self Care | Payer: 59 | Attending: Emergency Medicine | Admitting: Emergency Medicine

## 2020-09-21 ENCOUNTER — Encounter (HOSPITAL_COMMUNITY): Payer: Self-pay | Admitting: *Deleted

## 2020-09-21 ENCOUNTER — Other Ambulatory Visit: Payer: Self-pay

## 2020-09-21 DIAGNOSIS — R059 Cough, unspecified: Secondary | ICD-10-CM | POA: Diagnosis present

## 2020-09-21 DIAGNOSIS — Z20822 Contact with and (suspected) exposure to covid-19: Secondary | ICD-10-CM | POA: Diagnosis not present

## 2020-09-21 DIAGNOSIS — J302 Other seasonal allergic rhinitis: Secondary | ICD-10-CM | POA: Insufficient documentation

## 2020-09-21 NOTE — ED Provider Notes (Signed)
[facility] DEPT Provider Note   CSN: [id number] Arrival date & time: 09/21/20  1430     History Chief Complaint  Patient presents with  . Cough  . Covid Exposure    Beverly Hunter is a 25 y.o. female past medical history of seasonal allergies, presenting to the ED for evaluation of COVID exposure.  States she has been with a friend throughout the week that was positive for COVID.  She is vaccinated and boosted against COVID.  She is unsure if she is having any symptoms that she can relate to covid, attributes her symptoms to typical seasonal allergy symptoms.  Some nasal congestion, throat irritation, cough.`No fevers.  She states her mother told her she needed to come into the ED for testing.  The history is provided by the patient.       Past Medical History:  Diagnosis Date  . Family history of gene mutation   . Family history of ovarian cancer     Patient Active Problem List   Diagnosis Date Noted  . Genetic testing 09/15/2020  . 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 Family history of gene mutation   . Family history of ovarian cancer 08/11/2020  . Poor concentration 04/22/2020  . Allergic rhinitis 04/21/2020  . Obesity (BMI 35.0-39.9 without comorbidity) 05/04/2010    History reviewed. No pertinent surgical history.   OB History   No obstetric history on file.     Family History  Problem Relation Age of Onset  . Other Maternal Aunt        BRIP1+  . Ovarian cancer Half-Sister 35  . Cancer Cousin        vulvar; BRIP1+    Social History   Tobacco Use  . Smoking status: Never Smoker  . Smokeless tobacco: Never Used  Substance Use Topics  . Alcohol use: Never  . Drug use: Never    Home Medications Prior to Admission medications   Medication Sig Start Date End Date Taking? Authorizing Provider  FLUoxetine (PROZAC) 20 MG tablet Take 20 mg by mouth daily.    [provider]  fluticasone (FLONASE) 50  MCG/ACT nasal spray Place 2 sprays into both nostrils daily. 04/21/20   McDiarmid, Blane Ohara, MD  levocetirizine (XYZAL) 5 MG tablet Take 5 mg by mouth every evening.    [provider]  Multiple Vitamin (MULTIVITAMIN WITH MINERALS) TABS tablet Take 1 tablet by mouth daily.    [provider]  orlistat (ALLI) 60 MG capsule Take 60 mg by mouth 3 (three) times daily with meals.    [provider]    Allergies    Patient has no known allergies.  Review of Systems   Review of Systems  All other systems reviewed and are negative.   Physical Exam Updated Vital Signs BP 139/86 (BP Location: Left Arm)   Pulse 72   Temp 98.5 F (36.9 C) (Oral)   Resp 18   LMP 08/31/2020   SpO2 93%   Physical Exam Vitals and nursing note reviewed.  Constitutional:      General: She is not in acute distress.    Appearance: She is well-developed. She is not ill-appearing.  HENT:     Head: Normocephalic and atraumatic.  Eyes:     Conjunctiva/sclera: Conjunctivae normal.  Cardiovascular:     Rate and Rhythm: Normal rate.  Pulmonary:     Effort: Pulmonary effort is normal.     Comments: Speaking in full sentences Neurological:  Mental Status: She is alert.  Psychiatric:        Mood and Affect: Mood normal.        Behavior: Behavior normal.     ED Results / Procedures / Treatments   Labs (all labs ordered are listed, but only abnormal results are displayed) Labs Reviewed  SARS CORONAVIRUS 2 (TAT 6-24 HRS)    EKG None  Radiology DG Chest Portable 1 View  Result Date: 09/21/2020 CLINICAL DATA:  25 year old female with shortness of breath. EXAM: PORTABLE CHEST 1 VIEW COMPARISON:  None. FINDINGS: The heart size and mediastinal contours are within normal limits. Both lungs are clear. The visualized skeletal structures are unremarkable. IMPRESSION: No active disease. Electronically Signed   By: Anner Crete M.D.   On: 09/21/2020 15:20    Procedures Procedures    Medications Ordered in ED Medications - No data to display  ED Course  I have reviewed the triage vital signs and the nursing notes.  Pertinent labs & imaging results that were available during my care of the patient were reviewed by me and considered in my medical decision making (see chart for details).    MDM Rules/Calculators/A&P                          Patient presenting for COVID exposure and COVID test.  Reports symptoms that are typical of her seasonal allergies, is unsure if she is having any COVID symptoms.  She is vaccinated and boosted.  She is well-appearing and in no distress.  Vital signs are normal.  Chest x-ray ordered in triage is negative.  COVID swab is pending.  She is instructed she can follow her results on MyChart, verbalized understanding of this.  Discussed home isolation precautions per current CDC guidelines, symptomatic management, return precautions.  Patient verbalized understanding and is in agreement with care plan.  Discussed results, findings, treatment and follow up. Patient advised of return precautions. Patient verbalized understanding and agreed with plan.  Beverly Hunter was evaluated in Emergency Department on 09/21/2020 for the symptoms described in the history of present illness. She was evaluated in the context of the global COVID-19 pandemic, which necessitated consideration that the patient might be at risk for infection with the SARS-CoV-2 virus that causes COVID-19. Institutional protocols and algorithms that pertain to the evaluation of patients at risk for COVID-19 are in a state of rapid change based on information released by regulatory bodies including the CDC and federal and state organizations. These policies and algorithms were followed during the patient's care in the ED.  Final Clinical Impression(s) / ED Diagnoses Final diagnoses:  Contact with and (suspected) exposure to covid-19    Rx / DC Orders ED Discharge Orders    None        Yuvan Medinger, [place] N, PA-C 09/21/20 1736    Daleen Bo, MD 09/21/20 2336

## 2020-09-21 NOTE — ED Notes (Signed)
An After Visit Summary was printed and given to the patient. Discharge instructions given and no further questions at this time.  

## 2020-09-21 NOTE — ED Provider Notes (Signed)
Emergency Medicine Provider Triage Evaluation Note  Beverly Hunter , a 25 y.o. female  was evaluated in triage.  Pt complains of cough, sore throat, myalgias and slight shortness of breath since waking up today.  Believes she had a COVID exposure recently.  Patient has been vaccinated.  Denies any chest pain.  Review of Systems  Positive: Cough, myalgias, sore with Negative: Chest pain  Physical Exam  BP 139/86 (BP Location: Left Arm)   Pulse 72   Temp 98.5 F (36.9 C) (Oral)   Resp 18   SpO2 93%  Gen:   Awake, no distress   Resp:  Normal effort  MSK:   Moves extremities without difficulty  Other:  Speaking complete sentences without signs of respiratory distress  Medical Decision Making  Medically screening exam initiated at 3:01 PM.  Appropriate orders placed.  Beverly Hunter was informed that the remainder of the evaluation will be completed by another provider, this initial triage assessment does not replace that evaluation, and the importance of remaining in the ED until their evaluation is complete.  COVID test and chest x-ray ordered   Dietrich Pates, PA-C 09/21/20 1502    Terrilee Files, MD 09/22/20 [phone]

## 2020-09-21 NOTE — Discharge Instructions (Addendum)
Please read instructions below.  You can alternate Tylenol/acetaminophen and Advil/ibuprofen/Motrin every 4 hours for sore throat, body aches, headache or fever.  Drink plenty of water.  Use saline nasal spray for congestion. Wash your hands frequently. You have a COVID test pending. Please isolate at home while awaiting your results.  You can follow your results on MyChart. > If your test is negative, stay home until your fever has resolved/your symptoms are improving. > If your test is positive, isolate at home for at least 5 days after the day your symptoms initially began, and THEN at least 24 hours after you are fever-free without the help of medications AND your symptoms are improving. Only once your symptoms are improving and you are fever-free can you come out out of quarantine. Once, then you should wear a mask in public for another 5 days. Follow up with your primary care provider. Return to the ER for significant shortness of breath, uncontrollable vomiting, severe chest pain, or other concerning symptoms.

## 2020-09-21 NOTE — ED Triage Notes (Signed)
Pt reports cough, dizziness, congestion since this morning. She was exposed to a friend that has COVID.

## 2020-09-22 LAB — SARS CORONAVIRUS 2 (TAT 6-24 HRS): SARS Coronavirus 2: NEGATIVE

## 2020-09-29 ENCOUNTER — Encounter: Payer: Self-pay | Admitting: Family Medicine

## 2020-10-04 ENCOUNTER — Ambulatory Visit (INDEPENDENT_AMBULATORY_CARE_PROVIDER_SITE_OTHER): Payer: 59 | Admitting: Family Medicine

## 2020-10-04 DIAGNOSIS — E669 Obesity, unspecified: Secondary | ICD-10-CM | POA: Diagnosis not present

## 2020-10-04 NOTE — Patient Instructions (Addendum)
Note: Most nuts provide about 800 calories per cup.    Goals remain the same: 1. Walk (or other exercise) at least 30 minutes 3 times a week plus at least 15 minutes of home exercise (dumbbells and bands) 2 X week.    2. Eat at least 3 REAL meals and 1-2 snacks per day.  Eat breakfast within one hour of getting up.  Aim for no more than 5 hours between eating.    3. Take a few minutes on the weekend to plan for the week ahead (both meals & exercise).  Complete the Meal Planning Form provided to help in this process.    Document progress on your goals using the Goals Sheet previously provided.    Follow-up video appt on Tuesday, July 12 at 3 PM.

## 2020-10-04 NOTE — Progress Notes (Signed)
Telehealth Encounter PCP Acquanetta Belling, MD I connected with Beverly Hunter (MRN [id number]) on 10/04/2020 by MyChart video-enabled, HIPAA-compliant telemedicine application, verified that I was speaking with the correct person using two identifiers, and that the patient was in a private environment conducive to confidentiality.  The patient agreed to proceed.  Persons participating in visit were patient and provider (registered dietitian) Linna Darner, PhD, RD, LDN, CEDRD.  Provider was located at [facility] during this telehealth encounter; patient was at home.  Appt start time: 1400 end time: 1500 (1 hour)  Reason for telehealth visit: Referred by Acquanetta Belling, MD for Medical Nutrition Therapy related to weight management.  Relevant history/background: Beverly Hunter said she doesn't eat much, but still gains weight easily.  When she restricted to 1200 kcal/day, she lost weight, but regained during the pandemic once she stopped going to the gym, and was eating more while home.    Assessment: Beverly Hunter was COVID positive recently.  She is symptom-free now, and testing negative, and is mostly back to usual eating and exercise routines for nearly a week.  She has tried some new vegetables, and is especially interested in learning more ways of preparing healthy foods.  She has been using the goals sheet to document progress.  Significantly, she is drinking almost exclusively water, club soda, or Propel (sugar-free).   Weight today: 190 lb (self-rept), down from 215 lb in Feb 2022.   Recent eating pattern: 3 meals and 1 snack (fruit or string chs or nuts) per day. Usual physical activity: Working out with DBs and bands ~15 min once weekly and doing YouTube Pilates classes 15-30 min 2-3 X wk.  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   24-hr recall:  (Up at 5 AM) B (6:30 AM)-  1/2 c hash browns, 2 eggs in olive oil spray, 2 slc bacon, club  soda Snk (9 AM)-  1 apple, 1 string cheese L (11 AM)-  2 slc [place] in olive oil spray, 1 c stir-fried spinach & kale, 2 slc bread, 1 slc provolone, club soda Snk ( PM)-  --- D (8 PM)-  1 c noodles, olive oil, 1/2 c spag meat sauce, 3/4 c spinach & kale, club soda Snk (9:53M)-  1 c Propel Typical day? Yes.    Intervention: Reviewed progress on behavioral goals, and provided meal planning form.    For recommendations and goals, see Patient Instructions.    Follow-up: Video visit in 4 weeks.   Beverly Hunter,[name]

## 2020-11-01 ENCOUNTER — Telehealth: Payer: 59 | Admitting: Family Medicine

## 2020-11-02 NOTE — Progress Notes (Signed)
Telehealth Encounter PCP Acquanetta Belling, MD I connected with Beverly Hunter (MRN [id number]) on 11/03/2020 by MyChart video-enabled, HIPAA-compliant telemedicine application, verified that I was speaking with the correct person using two identifiers, and that the patient was in a private environment conducive to confidentiality.  The patient agreed to proceed.  Persons participating in visit were patient and provider (registered dietitian) Beverly Darner, PhD, RD, LDN, CEDRD.  Provider was located at [facility] during this telehealth encounter; patient was at home.  Appt start time: 1630 end time: 1730 (1 hour)  Reason for telehealth visit: Referred by Acquanetta Belling, MD for Medical Nutrition Therapy related to weight management.  Relevant history/background: Beverly Hunter said she doesn't eat much, but still gains weight easily.  When she restricted to 1200 kcal/day, she lost weight, but regained during the pandemic once she stopped going to the gym, and was eating more while home.    Assessment: [name] has had a rough week with a lot of unexpected things come up, so both food goals and exercise has been more challenging.  She has not consistently using her Goals Sheet for documenting, but has determined she's more likely to follow through with it if it's on her iPad.     Weight today: 193 lb (self-rept; 11/02/20), up 3 lb in 4 wks, but down from 215 lb in Feb 2022.   Recent eating pattern: 3 meals and 1 snack per day most days.  Has started to feel more comfortable with prep of meals she is doing more consistently.   Usual physical activity: Working out 3-4 X wk: DBs and bands ~15 min 2 X week and a variety of 15-30-min YouTube video classes 1-2 X wk.   24-hr recall:  (Up at 9 AM) B (9:30 AM)-  12 oz Premier pro shake Snk ( AM)-  water L (11:30 PM)-  6 oz chx, 2 c frzn zucc&squash, club soda Snk (2:30)-  2 slc watermelon D (6 PM)-  6 oz chx, 2 c stir-fried kale&spinach (olive  oil), club soda Snk ( PM)-  --- Typical day? No.  Normally has breakfast (eggs, 2 slc bacon, 2 toast w/ small amt butter or 2 baby potatoes w/ olive oil).  Intervention: Reviewed recent diet and exercise history, and confirmed behavioral goals.    For recommendations and goals, see Patient Instructions.    Follow-up: Video visit in 5 weeks.   [name],Beverly

## 2020-11-03 ENCOUNTER — Ambulatory Visit (INDEPENDENT_AMBULATORY_CARE_PROVIDER_SITE_OTHER): Payer: 59 | Admitting: Family Medicine

## 2020-11-03 ENCOUNTER — Other Ambulatory Visit: Payer: Self-pay

## 2020-11-03 DIAGNOSIS — E669 Obesity, unspecified: Secondary | ICD-10-CM | POA: Diagnosis not present

## 2020-11-03 NOTE — Patient Instructions (Signed)
Goals remain the same: 1. Walk (or other exercise) at least 30 minutes 3 times a week plus at least 15 minutes of home exercise (dumbbells and bands) 2 X week.    2. Eat at least 3 REAL meals and 1-2 snacks per day.  Eat breakfast within one hour of getting up.  Aim for no more than 5 hours between eating.    3. Take a few minutes on the weekend to plan for the week ahead (both meals & exercise).  Complete the Meal Planning Form provided to help in this process.    Upload your Goals Sheet to your iPad as a Pages document.    Follow-up video appt on Monday, Aug 14 at 3 PM.

## 2020-12-05 ENCOUNTER — Ambulatory Visit (INDEPENDENT_AMBULATORY_CARE_PROVIDER_SITE_OTHER): Payer: 59 | Admitting: Family Medicine

## 2020-12-05 ENCOUNTER — Other Ambulatory Visit: Payer: Self-pay

## 2020-12-05 DIAGNOSIS — E669 Obesity, unspecified: Secondary | ICD-10-CM

## 2020-12-05 NOTE — Progress Notes (Signed)
Telehealth Encounter PCP Acquanetta Belling, MD I connected with Beverly Hunter (MRN [id number]) on 11/03/2020 by MyChart video-enabled, HIPAA-compliant telemedicine application, verified that I was speaking with the correct person using two identifiers, and that the patient was in a private environment conducive to confidentiality.  The patient agreed to proceed.  Persons participating in visit were patient and provider (registered dietitian) Linna Darner, PhD, RD, LDN, CEDRD.  Provider was located at [facility] during this telehealth encounter; patient was at home.  Appt start time: 1500 end time: 1600 (1 hour)  Reason for telehealth visit: Referred by Acquanetta Belling, MD for Medical Nutrition Therapy related to weight management.  Relevant history/background: Beverly Hunter said she doesn't eat much, but still gains weight easily.  When she restricted to 1200 kcal/day, she lost weight, but regained during the pandemic once she stopped going to the gym, and was eating more while home.    Assessment: Beverly Hunter has continued her usual busy schedule, but she has managed to meet her exercise goals with multi-tasking her exercise (see below).  She has been less successful eating 3 meals per day or getting nutritional balance at meals.   Weight today: 190 lb on 12/01/20 (self-rept), but fluctuates from 190 to 193; down from 215 lb in Feb 2022.   Recent eating pattern: 2-3 meals and 1 snack per day most days.   Usual physical activity: Walking once a week; now has an under-desk  pedaller, which she has been using at least once a day while watching TV or online lectures; also using DBs sometimes while using pedals; and exercise band workout ~15 min 2 X week.   24-hr recall:  (Up at 10 AM) B ( 1:15AM)-  1 fried egg, 1 potato hash browns, 2 slc bacon, 1 apple, club soda  Snk ( AM)-  water L ( PM)-  Water, club soda Snk (4 PM)-  Fiber One bar D (9 PM)-  1 1/2 c ramen noodles in chx broth, 2 c  zucchini, 24 oz propel  Snk ( PM)-  --- Typical day? No.  More typical is to eat lunch.    Intervention: Reviewed recent diet and exercise history, confirmed behavioral goals, and discussed what Beverly Hunter can do to help follow through with intended lunch meals.       For recommendations and goals, see Patient Instructions.    Follow-up: Video visit in 4 weeks.   Beverly Hunter,[name]

## 2020-12-05 NOTE — Patient Instructions (Addendum)
What you can do to help prevent skipping lunch:  - Be assertive about your own food needs and preferences.  Remember, when you do this, it reinforces for both those around you and YOU, the behaviors you are trying to make habitual (such as eating 3 meals a day).   - Make a list of easy, fast lunches you can make at home.   - Make a list of lunches you can pack.   - Review these lists to determine groceries you need.   - Ask your mom to get involved in meal planning and with eating lunch with you (keeping you accountable).   - Tomorrow: Call your uncle to ask about lunch.    - Email [name] your lists of meals no later than Aug 22.    Goals remain the same: 1. Exercise at least 30 minutes 3 times a week plus at least 15 minutes of home exercise (dumbbells and bands) 2 X week.   2. Eat at least 3 REAL meals and 1-2 snacks per day.  Eat breakfast within one hour of getting up.  Aim for no more than 5 hours between eating.    3. Take a few minutes on the weekend to plan for the week ahead (both meals & exercise).  Complete the Meal Planning Form provided to help in this process.    Use the Goals Sheet on your iPad to document.  Review progress weekly.    Follow-up video appt on Monday, September 12 at 2:30 PM.

## 2021-01-02 ENCOUNTER — Ambulatory Visit (INDEPENDENT_AMBULATORY_CARE_PROVIDER_SITE_OTHER): Payer: 59 | Admitting: Family Medicine

## 2021-01-02 ENCOUNTER — Other Ambulatory Visit: Payer: Self-pay

## 2021-01-02 DIAGNOSIS — E669 Obesity, unspecified: Secondary | ICD-10-CM | POA: Diagnosis not present

## 2021-01-02 NOTE — Progress Notes (Signed)
Telehealth Encounter PCP Acquanetta Belling, MD Telehealth Encounter for Medical Nutrition Therapy (MNT)  I connected with Beverly Hunter (MRN [id number]) on 01/02/2021 by Caregility video-enabled, HIPAA-compliant telemedicine application, verified that I was speaking with the correct person using two identifiers, and that the patient was in a private environment conducive to confidentiality.  The patient agreed to proceed.  Persons participating in visit were patient and provider (registered dietitian) Beverly Darner, PhD, RD, LDN, CEDRD.  Provider was located at [facility] during this telehealth encounter; patient was at home.  Appt start time: 1430 end time: 1530 (1 hour)  Reason for telehealth visit: Referred by Acquanetta Belling, MD for Medical Nutrition Therapy related to weight management.  Relevant history/background: Beverly Hunter said she doesn't eat much, but still gains weight easily.  When she restricted to 1200 kcal/day, she lost weight, but regained during the pandemic once she stopped going to the gym, and was eating more while home.    Assessment: Beverly Hunter has been making a larger meal for lunch some days, which she uses for dinner as well as lunch the next day.  Trying to vary the protein, starch, and veg sources through the week.  She has also made her preference for meals known when eating with others, which has really been "no big deal," she's discovered, not imposing on anyone after all.  She had slipped into not planning a few weeks ago, so was not eating with regularity, ordering pizza or other takeout.  She gained 10 lb in about 2 weeks; the lesson learned she said was to prioritize her own self-care during especially busy times.  Beverly Hunter has yet to start planning and following up on snacks, but she is again getting regular meals, finding that setting phone alarms to be especially helpful.  Weight today: 196 lb today (self-rept), down from 200 two wks ago; down from  215 lb in Feb 2022.   Recent eating pattern: 2-3 meals and 0 snack per day most days.   Usual physical activity: Usually exercising in early AM.  Using DBs and bands for  ~30-min workout 5 X week, and walking 1 X wk.   24-hr recall:  (Up at 9 AM) B (9 AM)-  2 slc bacon, 2 fried eggs, 1 small red potato, club soda Snk ( AM)-  --- L (1 PM)-  Fried marinated tofu, 1 c grn beans, 1 c rice, club soda Snk ( PM)-  Green tea D (7:30 PM)-  Fried marinated tofu, 1 c grn beans, 1 c rice, club soda Snk ( PM)-  --- Typical day? Yes.    Intervention: Reviewed recent diet and exercise history, and modified exercise goal slightly.   For recommendations and goals, see Patient Instructions.    Follow-up: Video visit in 6 weeks.   Beverly Hunter,[name]

## 2021-01-02 NOTE — Patient Instructions (Signed)
Goals remain essentially the same: 1. Exercise at least 30 minutes with dumbbells and bands 5 X week plus walk at least 30 minutes 3 X wk.     - Consider writing out your strength workout each time you do it, and collecting these on large index cards.     2. Eat at least 3 REAL meals and 1-2 snacks per day.  Eat breakfast within one hour of getting up.  Aim for no more than 5 hours between eating.      - Continue to set phone alarms to remind you to eat.     3. Take a few minutes on the weekend to plan for the week ahead (both meals & exercise). Use meal ideas from the iPad form you created.  Add snack ideas to your planning form, e.g., any fresh fruit, nuts, string cheese.     Use the Goals Sheet on your iPad to document, and review progress weekly.    Follow-up video appt on Monday, October 24 at 2:30 PM.

## 2021-02-13 ENCOUNTER — Other Ambulatory Visit: Payer: Self-pay

## 2021-02-13 ENCOUNTER — Ambulatory Visit (INDEPENDENT_AMBULATORY_CARE_PROVIDER_SITE_OTHER): Payer: 59 | Admitting: Family Medicine

## 2021-02-13 DIAGNOSIS — Z713 Dietary counseling and surveillance: Secondary | ICD-10-CM | POA: Diagnosis not present

## 2021-02-13 DIAGNOSIS — E669 Obesity, unspecified: Secondary | ICD-10-CM

## 2021-02-13 NOTE — Progress Notes (Signed)
Telehealth Encounter PCP Acquanetta Belling, MD Telehealth Encounter for Medical Nutrition Therapy (MNT)  I connected with Beverly Hunter (MRN [id number]) on 02/13/2021 by Caregility video-enabled, HIPAA-compliant telemedicine application, verified that I was speaking with the correct person using two identifiers, and that the patient was in a private environment conducive to confidentiality.  The patient agreed to proceed.  Persons participating in visit were patient and provider (registered dietitian) Beverly Darner, PhD, RD, LDN, CEDRD.  Provider was located at [facility] during this telehealth encounter; patient was at home.  Appt start time: 1430 end time: 1530 (1 hour)  Reason for telehealth visit: Referred by Acquanetta Belling, MD for Medical Nutrition Therapy related to weight management.  Relevant history/background: Beverly Hunter said she doesn't eat much, but still gains weight easily.  When she restricted to 1200 kcal/day, she lost weight, but regained during the pandemic once she stopped going to the gym, and was eating more while home.    Assessment: Beverly Hunter felt she has made good choices (until last week, when she was sick, and had a poor appetite).  She also feels she has done better in prioritizing her own needs over others.  She has been setting her phone alarm to remind herself to eat.  Has also done weekly planning except this past week when she'd started feeling sick.  Beverly Hunter has increased walking to an hour 5 X wk; watches shows while on TM, and has limited watching to only when at the gym.  She said she'd like to change her physical activity goal to just walking (no strength ex for now).   Weight today: Has not weighed recently (196 lb on 01/02/21, down from 215 lb in Feb 2022).   Recent eating pattern: 3 meals and 0-2 snacks most days.   Usual physical activity: 60 min TM walking (5-6 incline) 5 X wk; no strength training recently.   No food recall b/c was sick  yesterday.    Intervention: Reviewed recent diet and exercise history, and modified exercise goal slightly.   For recommendations and goals, see Patient Instructions.    Follow-up: Video visit in 6 weeks.   Beverly Hunter,Beverly Hunter

## 2021-02-13 NOTE — Patient Instructions (Signed)
Goals: 1. Walk at least 60 minutes 5 X wk.   2. Eat at least 3 REAL meals and 1-2 snacks per day.  Eat breakfast within one hour of getting up.  Aim for no more than 5 hours between eating.  Continue to set phone alarms to remind you to eat.    3. Take a few minutes on the weekend to plan for the week ahead (both meals & exercise). Try to set a consistent day and time.    Use the Goals Sheet on your iPad to document, and review progress weekly.  A good time for this weekly review might be when you are doing your planning for the upcoming week.      - Consider a way of rewarding yourself for hitting your goals ___ number of weeks in a row.  This will require reviewing progress over time.  How might you reward yourself?    Follow-up video appt on Monday, December 5 at 2:30 PM.

## 2021-02-23 ENCOUNTER — Ambulatory Visit (INDEPENDENT_AMBULATORY_CARE_PROVIDER_SITE_OTHER): Payer: 59 | Admitting: Family Medicine

## 2021-02-23 ENCOUNTER — Encounter: Payer: Self-pay | Admitting: Family Medicine

## 2021-02-23 ENCOUNTER — Other Ambulatory Visit: Payer: Self-pay

## 2021-02-23 VITALS — BP 110/68 | HR 85 | Ht 63.0 in | Wt 209.0 lb

## 2021-02-23 DIAGNOSIS — R58 Hemorrhage, not elsewhere classified: Secondary | ICD-10-CM

## 2021-02-23 DIAGNOSIS — Z23 Encounter for immunization: Secondary | ICD-10-CM | POA: Diagnosis not present

## 2021-02-23 NOTE — Patient Instructions (Signed)
We are checking your iron levels and checking for anemia today.

## 2021-02-23 NOTE — Progress Notes (Signed)
20 minute office visit

## 2021-02-24 LAB — CBC
Hematocrit: 40.5 % (ref 34.0–46.6)
Hemoglobin: 13.3 g/dL (ref 11.1–15.9)
MCH: 29.1 pg (ref 26.6–33.0)
MCHC: 32.8 g/dL (ref 31.5–35.7)
MCV: 89 fL (ref 79–97)
Platelets: 306 10*3/uL (ref 150–450)
RBC: 4.57 x10E6/uL (ref 3.77–5.28)
RDW: 12.7 % (ref 11.7–15.4)
WBC: 9.2 10*3/uL (ref 3.4–10.8)

## 2021-02-24 LAB — FERRITIN: Ferritin: 45 ng/mL (ref 15–150)

## 2021-03-27 ENCOUNTER — Ambulatory Visit (INDEPENDENT_AMBULATORY_CARE_PROVIDER_SITE_OTHER): Payer: 59 | Admitting: Family Medicine

## 2021-03-27 ENCOUNTER — Other Ambulatory Visit: Payer: Self-pay

## 2021-03-27 DIAGNOSIS — E669 Obesity, unspecified: Secondary | ICD-10-CM | POA: Diagnosis not present

## 2021-03-27 DIAGNOSIS — Z713 Dietary counseling and surveillance: Secondary | ICD-10-CM

## 2021-03-27 NOTE — Patient Instructions (Addendum)
-   Until your Achilles tendons feel better, reduce the incline on the TM.  After your ankles feel fine, you may want to incorporate some Achilles stretches a few times a week.  It might also be a good idea to switch some of your workouts to the stationary bike to help prevent over-use injury.    - Work on shifting your sleep schedule toward going to bed by 9:30 PM and getting up by 6 AM.  Use 15 min-increments of change each week, which means it could take up to 8 weeks to make this change.    - Pay attention to your hunger, and try to respond appropriately. (This is where planning comes in b/c planning means you wil have some foods available.)     - Make a list of snack ideas, and keep those ingredients on hand.    Goals remain the same: 1. Walk at least 60 minutes 5 X wk.    2. Eat at least 3 REAL meals and 1-2 snacks per day.  Eat breakfast within one hour of getting up.  Aim for no more than 5 hours between eating.  Continue to set phone alarms to remind you to eat.     3. Take a few minutes on the weekend to plan for the week ahead (both meals & exercise). Try to set a consistent day and time.      - This includes planning leftovers, and how you will use them.     - Get back to using the Goals Sheet on your iPad to document, and review progress weekly.    - Consider a way of rewarding yourself for hitting your goals ___ number of weeks in a row.  This will require reviewing progress over time.  How might you reward yourself?    Follow-up video appt on Monday, January 30 at 2:30 PM.

## 2021-03-27 NOTE — Progress Notes (Signed)
Telehealth Encounter PCP Acquanetta Belling, MD Telehealth Encounter for Medical Nutrition Therapy (MNT)  I connected with Beverly Hunter (MRN [id number]) on 03/27/2021 by Caregility video-enabled, HIPAA-compliant telemedicine application, verified that I was speaking with the correct person using two identifiers, and that the patient was in a private environment conducive to confidentiality.  The patient agreed to proceed.  Persons participating in visit were patient and provider (registered dietitian) Linna Darner, PhD, RD, LDN, CEDRD.  Provider was located at [facility] during this telehealth encounter; patient was at home.  Appt start time: 1430 end time: 1530 (1 hour)  Reason for telehealth visit: Referred by Acquanetta Belling, MD for Medical Nutrition Therapy related to weight management.  Relevant history/background: Beverly Hunter said she doesn't eat much, but still gains weight easily.  When she restricted to 1200 kcal/day, she lost weight, but regained during the pandemic once she stopped going to the gym, and was eating more while home.   Beverly Hunter would like to change the sleep pattern she has fallen into in recent months, falling asleep early at waking at 4 or 4:30 AM.    Assessment: Beverly Hunter got "off-track" in the past week, usually getting balanced meals, but not getting snacks.  She has usually been planning ahead for both meals and exercise, although often gets overwhelmed with work obligations.   She has been variably successful in tracking progress on goals, still using the form she created on her iPad.   Weight today: Has not measured recently.  (196 lb on 01/02/21, down from 215 lb in Feb 2022).   Recent eating pattern: 3 meals and 0-2 snacks most days.   Usual physical activity: 60 min TM walking at incline 3 X wk (reduced frequency b/c of Achilles pain); no strength training.   24-hr recall:  (Up at 4:30 AM) Had 1 c coffee, 2 tbsp flavored creamer B (6 AM)-  2  fried eggs, 2 dry toast, 2 strips bacon, Propel Snk (9 AM)-  5 c Yoplait yogurt, 6 Triscuits, water L (12 PM)-  Stir-fry: 3 oz chx, 3 c zucchini, [place] water Snk ( PM)-  --- D (4 PM)-  4 oz chx, 2 c peas, water Snk ( PM)-  --- In bed by 7 PM Typical day? Yes.    Intervention: Reviewed recent diet and exercise history, confirmed goals, and discussed ways to help meet behavioral goals.   For recommendations and goals, see Patient Instructions.    Follow-up: Video visit in 8 weeks.   Beverly Hunter,Beverly Hunter

## 2021-05-22 ENCOUNTER — Telehealth: Payer: 59 | Admitting: Family Medicine

## 2021-05-23 ENCOUNTER — Ambulatory Visit (INDEPENDENT_AMBULATORY_CARE_PROVIDER_SITE_OTHER): Payer: 59 | Admitting: Family Medicine

## 2021-05-23 ENCOUNTER — Other Ambulatory Visit: Payer: Self-pay

## 2021-05-23 DIAGNOSIS — Z713 Dietary counseling and surveillance: Secondary | ICD-10-CM | POA: Diagnosis not present

## 2021-05-23 DIAGNOSIS — E669 Obesity, unspecified: Secondary | ICD-10-CM | POA: Diagnosis not present

## 2021-05-23 NOTE — Progress Notes (Signed)
Telehealth Encounter PCP Acquanetta Belling, MD Telehealth Encounter for Medical Nutrition Therapy (MNT)  I connected with Beverly Hunter (MRN [id number]) on 05/23/2021 by Caregility video-enabled, HIPAA-compliant telemedicine application, verified that I was speaking with the correct person using two identifiers, and that the patient was in a private environment conducive to confidentiality.  The patient agreed to proceed.  Persons participating in visit were patient and provider (registered dietitian) Beverly Darner, PhD, RD, LDN, CEDRD.  Provider was located at [facility] during this telehealth encounter; patient was at home.  Appt start time: 1330 end time: 1430 (1 hour)  Reason for telehealth visit: Referred by Acquanetta Belling, MD for Medical Nutrition Therapy related to weight management.  Relevant history/background: Beverly Hunter said she doesn't eat much, but still gains weight easily.  When she restricted to 1200 kcal/day, she lost weight, but regained during the pandemic once she stopped going to the gym, and was eating more while home.    Assessment: Beverly Hunter has continued a consistent eating pattern of 3 meals and 1 snack/day, although she has not been documenting behaviors since the holidays.     Weight today:  Not documented. (196 lb on 01/02/21, down from 215 lb in Feb 2022).   Eating pattern: 3 meals and 1 snack most days.   Physical activity:  30 min stationary bike 3 X wk; no strength training.  Achilles tendon pain has resolved.   Sleep:  Going to bed ~10 PM, and getting up at 5:30/6 AM.  Happy with this new sleep routine.  Advance planning:  Doing some for foods, but not for exercise.   24-hr recall:  (Up at 5:30 AM) B (6:20 AM)-  2 eggs, 2 bacon, 2 toast, water Snk (11 AM)-  5 oz vanilla yogurt L (3 PM)-  3 oz [place], 1 1/2 oz provolone, 2 slc bread, 1/2 c green beans, water Snk ( PM)-  --- D (8 PM)-  3 oz [place], 1 1/2 oz provolone, 2 slc bread, 1/2 c green  beans, 1/4 c walnuts, water Snk ( PM)-  --- Typical day? Yes.  Although usually eats dinner ~5 PM.    Intervention: Reviewed recent diet and exercise history, modified goals, and discussed rewards for meeting behavioral goals.   For recommendations and goals, see Patient Instructions.    Follow-up: Video visit in 6 weeks.   Beverly Hunter,[name]

## 2021-05-23 NOTE — Patient Instructions (Signed)
Goals: 1. Eat at least 3 REAL meals and 1-2 snacks per day.  Eat breakfast within one hour of getting up.  Aim for no more than 5 hours between eating.    - Vegetables: For both lunch and dinner, get twice the volume of veg's as either protein or starch goods.   2. Exercise at least 60 minutes 5 X wk.  - Incorporate variety to workouts to avoid overuse injury.   - Plan your exercise for the week.   - Create some written strength workouts.     - Get back to using the Goals Sheet on your iPad to document, and review progress weekly.     - Consider a way of rewarding yourself for meeting your goals 4 weeks in a row.  This will require documenting and reviewing progress over time.  How might you reward yourself?   Email Beverly Hunter some ideas no later than Feb 8.    Follow-up video appt on Tuesday, March 14 at 1:30 PM.

## 2021-07-04 ENCOUNTER — Other Ambulatory Visit: Payer: Self-pay

## 2021-07-04 ENCOUNTER — Ambulatory Visit (INDEPENDENT_AMBULATORY_CARE_PROVIDER_SITE_OTHER): Payer: 59 | Admitting: Family Medicine

## 2021-07-04 DIAGNOSIS — E669 Obesity, unspecified: Secondary | ICD-10-CM | POA: Diagnosis not present

## 2021-07-04 DIAGNOSIS — Z713 Dietary counseling and surveillance: Secondary | ICD-10-CM | POA: Diagnosis not present

## 2021-07-04 NOTE — Progress Notes (Signed)
Telehealth Encounter ?PCP Beverly Belling, MD ?Telehealth Encounter for Medical Nutrition Therapy (MNT)  ?I connected with Beverly Hunter (MRN [id number]) on 07/04/2021 by Caregility video-enabled, HIPAA-compliant telemedicine application, verified that I was speaking with the correct person using two identifiers, and that the patient was in a private environment conducive to confidentiality.  The patient agreed to proceed. ? ?Persons participating in visit were patient and provider (registered dietitian) Beverly Darner, PhD, RD, LDN, CEDRD as well as Beverly Kinds, MD.  Provider was located at [facility] during this telehealth encounter; patient was at home. ? ?Appt start time: 1330 end time: 1430 (1 hour) ? ?Reason for telehealth visit: Referred by Beverly Belling, MD for Medical Nutrition Therapy related to weight management. ? ?Relevant history/background: Beverly Hunter said she doesn't eat much, but still gains weight easily.  When she restricted to 1200 kcal/day, she lost weight, but regained during the pandemic once she stopped going to the gym, and was eating more while home.   ? ?Assessment: Beverly Hunter has been very busy, so hasn't made it to the gym, but is doing home strength workouts.  She has not weighed herself in several weeks.  Feels she's doing ok with food choices, but often missing lunch, and really wants to get back to more exercise.   ?Weight today:  Not documented. (209 lb on 02/23/21; up from 193 lb on 09/06/20).   ?Eating pattern: 2-3 meals and 1 snack most days.   ?Physical activity: ~30-min home strength workouts ~3 X wk.   ?Sleep:  Going to bed ~10 PM, and getting up at 5:30/6 AM.   ?Advance planning:  Planning intermittently.   ?24-hr recall:  ?(Up at 6 AM) ?B (7:30 AM)-  2 fried eggs (olive oil spray), 2 slc bacon, 2 toast, 1 slc cheese, water ?Snk ( AM)-  water ?L ( PM)-  water ?Snk ( PM)-  --- ?D (5 PM)-  1 turkey&cheese sandw, 2 c stir-fried zucchini, water ?Snk (7 PM)-  5  oz Yoplait yogurt, 1/4 c almonds, water ?Typical day? Yes.   ? ?Intervention: Reviewed recent diet and exercise history, and encouraged restarting documentation of progress on goals.  ? ?For recommendations and goals, see Patient Instructions.   ? ?Follow-up: Video visit in 4 weeks.  ? ?Beverly Hunter  ?  ?

## 2021-07-04 NOTE — Patient Instructions (Signed)
Goals: ?1. Eat at least 3 REAL meals and 1-2 snacks per day.  Eat breakfast within one hour of getting up.  Aim for no more than 5 hours between eating.    ?- Vegetables: In at least 10 meals a week, get twice the volume of veg's as either protein or starch goods for both lunch and dinner.  ?  ?2. Exercise at least 60 minutes 5 X wk.  ?    - Incorporate variety to workouts to avoid overuse injury.   ? ?3. Set aside planning time on Sundays to determine at least 3 meals you will make in the coming week and which days you will exercise.   ? ?- Use the Goals Sheet on your iPad to document, and review progress weekly.   ? ?- Email [name] a progress report no later than Mon, Mar 27.   ?  ?Follow-up video appt on Tuesday, April 11 at 2 PM. ?  ?

## 2021-08-01 ENCOUNTER — Ambulatory Visit (INDEPENDENT_AMBULATORY_CARE_PROVIDER_SITE_OTHER): Payer: 59 | Admitting: Family Medicine

## 2021-08-01 DIAGNOSIS — E669 Obesity, unspecified: Secondary | ICD-10-CM | POA: Diagnosis not present

## 2021-08-01 NOTE — Patient Instructions (Signed)
Goals: ?1. Eat at least 3 REAL meals and 1-2 snacks per day.  Eat breakfast within one hour of getting up.  Aim for no more than 5 hours between eating.    ?- Include vegetables: In at least 10 meals a week, get twice the volume of veg's as either protein or starch goods for both lunch and dinner.  ?  ?2. Set aside planning time on Sundays to determine at least 3 meals you will make in the coming week and which days you will exercise.   Add some recipe ideas to your meal planning form.   ?  ?3. Exercise at least 60 minutes 5 X wk.  ? ?- Use the Goals Sheet on your iPad to document, and review progress weekly.   ?- Email Beverely Low a progress report no later than Tuesday, April 18: How are you doing on your goals, and have you tried a new recipe yet? ? ?Miscellaneous:  ?- Canned soup, first microwave fresh or frozen veg's, then add soup, and reheat.   ?- Recipes: https://www.doctoryum.org/ ?- Also check out [employer]: ElevatorPitchers.com.au ?- Protein: When using beans as the main protein source, always consider 1 full cup to be the minimum.   ? ?- HAVE A PLAN FOR VACATION TO HELP YOU MAKE REASONABLY GOOD CHOICES FOR FOOD AND PHYSICAL ACTIVITY.  For example, find out about exercise equipment, classes, and opportunities ahead of time, and have at least a loose plan of when you will exercise.  If food is offered buffet-style, look over all your choices first to determine what foods you most want to choose, aiming for balance between carb and protein foods, as well as vegetables and fruits.   ? ?Follow-up video appt on Tuesday, May 30 at 1:30 PM. ?  ?

## 2021-08-01 NOTE — Progress Notes (Signed)
Telehealth Encounter ?PCP Acquanetta Belling, MD ?Telehealth Encounter for Medical Nutrition Therapy (MNT)  ?I connected with Noa Galvao (MRN [id number]) on 08/01/2021 by Caregility video-enabled, HIPAA-compliant telemedicine application, verified that I was speaking with the correct person using two identifiers, and that the patient was in a private environment conducive to confidentiality.  The patient agreed to proceed. ? ?Persons participating in visit were patient and provider (registered dietitian) Linna Darner, PhD, RD, LDN, CEDS.  Provider was located at [facility] during this telehealth encounter; patient was at home. ? ?Appt start time: 1400 end time: 1500 (1 hour) ? ?Reason for telehealth visit: Referred by Acquanetta Belling, MD for Medical Nutrition Therapy related to weight management. ? ?Relevant history/background: Calen said she doesn't eat much, but still gains weight easily.  When she restricted to 1200 kcal/day, she lost weight, but regained during the pandemic once she stopped going to the gym, and was eating more while home.   ? ?Assessment: Kamali recently spent a week in [place] with her friend, where she was out of her routines in terms of eating, exercise, and sleep, but she feels she is back on track now.  She has restarted setting phone alarms for a consistent eating schedule.  She bought an exercise bike for home use.  ?Weight today:  Not documented. (209 lb on 02/23/21; up from 193 lb on 09/06/20).   ?Eating pattern: 2-3 meals and 1 snack most days.   ?Physical activity: ~60 min stationary bike 5 X wk.   ?Sleep:  Going to bed ~10 PM, and getting up at 5:30/6 AM.   ?Advance planning:  Planning most weeks.   ?24-hr recall:  ?(Up at 5:30 AM) ?B (5:45 AM)-  2 eggs, 2 toast, 2 bacon, 1 c coffee, 2 tbsp flavored creamer (15 kcal/svng) ?6 AM: 60 min stationary bike; water  ?Snk ( AM)-  --- ?L (1 2:30)-  2 c Progresso chx noodle soup, water ?Snk (4 PM)-  16 Triscuits, 1  string cheese, water ?D (9 PM)-  2 c grn beans, lima bean, corn mix, 1 c rice, water ?Snk ( PM)-  --- ?Typical day? Yes.   Has been drinking 3-4 X 32 oz water/day.  ? ?Intervention: Reviewed recent diet and exercise history, and confirmed behavioral goals.  Also discussed plans to help her stay on track when she takes a week-long cruise in May.   ? ?For recommendations and goals, see Patient Instructions.   ? ?Follow-up: Video visit in 8 weeks.  ? ?Danilynn Jemison,[name]  ?  ?

## 2021-09-19 ENCOUNTER — Ambulatory Visit (INDEPENDENT_AMBULATORY_CARE_PROVIDER_SITE_OTHER): Payer: 59 | Admitting: Family Medicine

## 2021-09-19 DIAGNOSIS — E669 Obesity, unspecified: Secondary | ICD-10-CM

## 2021-09-19 NOTE — Progress Notes (Signed)
Telehealth Encounter PCP Acquanetta Belling, MD Telehealth Encounter for Medical Nutrition Therapy (MNT)  I connected with Beverly Hunter (MRN [id number]) on 09/19/2021 by Caregility video-enabled, HIPAA-compliant telemedicine application, verified that I was speaking with the correct person using two identifiers, and that the patient was in a private environment conducive to confidentiality.  The patient agreed to proceed.  Persons participating in visit were patient and provider (registered dietitian) Linna Darner, PhD, RD, LDN, CEDS.  Provider was located at [facility] during this telehealth encounter; patient was at home.  Appt start time: 1330 end time: 1400 (30 minutes)  Reason for telehealth visit: Referred by Acquanetta Belling, MD for Medical Nutrition Therapy related to weight management.  Relevant history/background: Beverly Hunter said she doesn't eat much, but still gains weight easily.  When she restricted to 1200 kcal/day, she lost weight, but regained during the pandemic once she stopped going to the gym, and was eating more while home.    Assessment: [name] went on the cruise with family members this month, after which she and her mom came down with COVID.  She has now tested negative, and feels mostly recovered.  On the cruise, she walked a lot, on both ship and land, averaging >3 miles/day.  [name] felt she made some very good food choices during her trip.  Appetite is not quite back to normal post-COVID, but she is now eating 3 small meals/day.   Weight today:  Not documented. (209 lb on 02/23/21; up from 193 lb on 09/06/20).   Eating pattern: 3 small meals and 0 snacks.   Physical activity: 30 min stationary bike this morning (first exercise since getting sick).   Sleep:  Slept poorly when had COVID, but sleep is improving.   Advance planning:  Had been planning pre-cruise and COVID.    Intervention: Reviewed recent diet and exercise history, and confirmed  behavioral goals.      For recommendations and goals, see Patient Instructions.    Follow-up: Video visit in 6 weeks.   Gabrielle Mester,[name]

## 2021-09-19 NOTE — Patient Instructions (Addendum)
Goals remain the same: 1. Eat at least 3 REAL meals and 1-2 snacks per day.  Eat breakfast within one hour of getting up.  Aim for no more than 5 hours between eating.    - Include vegetables: In at least 10 meals a week, get twice the volume of veg's as either protein or starch goods for both lunch and dinner.   2. Set aside planning time on Sundays to determine at least 3 meals you will make in the coming week and which days you will exercise.   Add some recipe ideas to your meal planning form.     3. Exercise at least 60 minutes 5 X wk.   - Use the Goals Sheet on your iPad to document, and review progress weekly.    Follow-up video appt on Tuesday, July 11 at 11 AM.

## 2021-09-26 ENCOUNTER — Encounter: Payer: Self-pay | Admitting: *Deleted

## 2021-10-31 ENCOUNTER — Ambulatory Visit (INDEPENDENT_AMBULATORY_CARE_PROVIDER_SITE_OTHER): Payer: 59 | Admitting: Family Medicine

## 2021-10-31 DIAGNOSIS — E669 Obesity, unspecified: Secondary | ICD-10-CM | POA: Diagnosis not present

## 2021-10-31 NOTE — Patient Instructions (Addendum)
-   Consider making space to keep the stationary bike set up all the time.    - Pay attention to your hunger levels, and make sure you get enough carbohydrate at meals for satisfaction.   Losing weight too fast can mean losing muscle mass (which lowers metabolic weight, making it increasingly hard to continue to lose.)  Goals remain the same: 1. Eat at least 3 REAL meals and 1-2 snacks per day.  Eat breakfast within one hour of getting up.  Aim for no more than 5 hours between eating.    - Include vegetables: In at least 10 meals a week, get twice the volume of veg's as either protein or starch foods.  - Include starch: Corn, potatoes, rice, bread, quinoa, barley, whole-grain crackers, pasta.    2. Set aside planning time on the weekend to determine foods needed for the week.     3. Exercise at least 60 minutes 5 X wk.  Remember that on days you are pressed for time, five minutes of exercise is five more than zero!     - Document the number of minutes of exercise you get.  Review weekly.     Follow-up video appt on Monday, Sept 18 at 11 AM.

## 2021-10-31 NOTE — Progress Notes (Signed)
Telehealth Encounter PCP Acquanetta Belling, MD Telehealth Encounter for Medical Nutrition Therapy (MNT)  I connected with Beverly Hunter (MRN [id number]) on 10/31/2021 by Caregility video-enabled, HIPAA-compliant telemedicine application, verified that I was speaking with the correct person using two identifiers, and that the patient was in a private environment conducive to confidentiality.  The patient agreed to proceed.  Persons participating in visit were patient and provider (registered dietitian) Linna Darner, PhD, RD, LDN, CEDS.  Provider was located at [facility] during this telehealth encounter; patient was at home.  Appt start time: 1100 end time: 1200 (1 hour)  Reason for telehealth visit: Referred by Acquanetta Belling, MD for Medical Nutrition Therapy related to weight management.  Relevant history/background: Beverly Hunter said she doesn't eat much, but still gains weight easily.  When she restricted to 1200 kcal/day, she lost weight, but regained during the pandemic once she stopped going to the gym, and was eating more while home.    Assessment: Beverly Hunter's last couple of weeks were chaotic; her mom fell twice, and her cat was sick.  Has consistently been getting a protein food and veg at lunch and dinner.  Beverly Hunter has weaned off of fluoxetine, and started Lexapro last week per her psychiatrist's recommendation.  Also started hydroxyzine to help with sleep.   Weight today:  Not documented (remote visit). (209 lb on 02/23/21; up from 193 lb on 09/06/20).   Eating pattern: 3 small meals and 0-2 snacks.   Physical activity: Up to 60 min 3 X wk.   Sleep:  Estimates ~8 hrs/night.  Hydroxyzine (started this month) has helped sleep through the night.    Advance planning:  Has been planning what foods needed to keep on hand for quick meal prep.    24-hr recall  (Up at 8 AM) B (8-9 AM)-  5 oz vanilla Grk yogurt, 2 eggs, 2 slc toast, 2 slc bacon, 8 spears asparagus, water Snk (  AM)-  --- L (1 PM)-  Frozen grilled chx brst, 3 c broccoli, water Snk (4 PM)-  1 string cheese D (7 PM)-  2 small chx breasts, 3 c zucchini, water Snk ( PM)-  water Typical day? Yes.     Intervention: Reviewed recent diet and exercise history, and modified behavioral goals.      For recommendations and goals, see Patient Instructions.    Follow-up: Video visit in 10 weeks.   Beverly Hunter,[name]

## 2021-11-03 ENCOUNTER — Other Ambulatory Visit: Payer: Self-pay | Admitting: Family Medicine

## 2021-11-03 ENCOUNTER — Encounter: Payer: Self-pay | Admitting: Family Medicine

## 2021-11-03 DIAGNOSIS — G4700 Insomnia, unspecified: Secondary | ICD-10-CM

## 2021-11-03 DIAGNOSIS — F909 Attention-deficit hyperactivity disorder, unspecified type: Secondary | ICD-10-CM

## 2021-11-03 DIAGNOSIS — F4310 Post-traumatic stress disorder, unspecified: Secondary | ICD-10-CM | POA: Insufficient documentation

## 2021-11-03 DIAGNOSIS — F900 Attention-deficit hyperactivity disorder, predominantly inattentive type: Secondary | ICD-10-CM | POA: Insufficient documentation

## 2021-11-03 DIAGNOSIS — F339 Major depressive disorder, recurrent, unspecified: Secondary | ICD-10-CM

## 2021-11-03 DIAGNOSIS — F411 Generalized anxiety disorder: Secondary | ICD-10-CM | POA: Insufficient documentation

## 2021-11-03 DIAGNOSIS — F331 Major depressive disorder, recurrent, moderate: Secondary | ICD-10-CM | POA: Insufficient documentation

## 2022-01-04 NOTE — Progress Notes (Deleted)
Telehealth Encounter PCP Acquanetta Belling, MD Telehealth Encounter for Medical Nutrition Therapy (MNT)  I connected with Beverly Hunter (MRN [id number]) on 01/04/2022 by Caregility video-enabled, HIPAA-compliant telemedicine application, verified that I was speaking with the correct person using two identifiers, and that the patient was in a private environment conducive to confidentiality.  The patient agreed to proceed.  Persons participating in visit were patient and provider (registered dietitian) Linna Darner, PhD, RD, LDN, CEDS.  Provider was located at [facility] during this telehealth encounter; patient was at home.  Appt start time: 1100 end time: 1200 (1 hour)  Reason for telehealth visit: Referred by Acquanetta Belling, MD for Medical Nutrition Therapy related to weight management.  Relevant history/background: [name] said she doesn't eat much, but still gains weight easily.  When she restricted to 1200 kcal/day, she lost weight, but regained during the pandemic once she stopped going to the gym, and was eating more while home.    Assessment: Beverly Hunter ***.   Weight today:  *** (remote visit). (209 lb on 02/23/21; up from 193 lb on 09/06/20).   Eating pattern: 3 meals and 0-2 snacks.   Physical activity: ***60 min ***3 X wk.   Sleep:  Estimates ~8 hrs/night.  Has still *** been taking hydroxyzine to help with sleep.   Advance planning:  Has been planning what foods needed to keep on hand for quick meal prep.    24-hr recall suggests intake of *** kcal:  (Up at  AM) B ( AM)-   Snk ( AM)-   L ( PM)-   Snk ( PM)-   D ( PM)-   Snk ( PM)-   Typical day? {yes [id number]}    Intervention: Reviewed recent diet and exercise history, and ***modified behavioral goals.      For recommendations and goals, see Patient Instructions.    Follow-up: Video visit in *** weeks.   Dela Sweeny,[name]   From 7/11 *** - Consider making space to keep the stationary bike set up all the  time.     - Make sure you get enough carbohydrate at meals for satisfaction.     Goals remain the same: 1. Eat at least 3 REAL meals and 1-2 snacks per day.  Eat breakfast within one hour of getting up.  Aim for no more than 5 hours between eating.    - Include vegetables: In at least 10 meals a week, get twice the volume of veg's as either protein or starch foods.  - Include starch: Corn, potatoes, rice, bread, quinoa, barley, whole-grain crackers, pasta.  2. Set aside planning time on the weekend to determine foods needed for the week.   3. Exercise at least 60 minutes 5 X wk.  Remember that on days you are pressed for time, five minutes of exercise is five more than zero!   - Document the number of minutes of exercise you get.  Review weekly.     Follow-up video appt on ***.

## 2022-01-08 ENCOUNTER — Telehealth: Payer: 59 | Admitting: Family Medicine

## 2022-01-11 NOTE — Progress Notes (Signed)
Telehealth Encounter PCP Lissa Morales, MD Telehealth Encounter for Medical Nutrition Therapy (MNT)  I connected with Mareesa Gathright (MRN [id number]) on 01/15/2022 by Caregility video-enabled, HIPAA-compliant telemedicine application, verified that I was speaking with the correct person using two identifiers, and that the patient was in a private environment conducive to confidentiality.  The patient agreed to proceed.  Persons participating in visit were patient and provider (registered dietitian) Kennith Center, PhD, RD, LDN, CEDS.  Provider was located at [place] during this telehealth encounter; patient was at home.  Appt start time: 1100 end time: 1200 (1 hour)  Reason for telehealth visit: Referred by Lissa Morales, MD for Medical Nutrition Therapy related to weight management.  Relevant history/background: Tariah said she doesn't eat much, but still gains weight easily.  When she restricted to 1200 kcal/day, she lost weight, but regained during the pandemic once she stopped going to the gym, and was eating more while home.    Assessment: Jamya has been especially busy between work and her mom's current health concerns.  She has increased carbs in her diet, and feels she's gained weight, although she has not measured her weight recently.   Weight today: Not documented (remote visit). (209 lb on 02/23/21; up from 193 lb on 09/06/20).   Eating pattern: 3 small meals and 1-2 snacks.   Physical activity: 30-45 min 3 X wk.   Sleep:  Estimates 7-8 hrs/night.  Taking hydroxyzine nightly.   Advance planning:  Has not been planning very often; would like to get back to this. 24-hr recall:  (Up at 8 AM) B (9 AM)-  2 fried eggs (olive oil), 2 toast, 2 slc provolone, 2 slc bacon, water Snk ( AM)-  water L (3 PM)-  3/4 chx brst, 3-4 c broc, 1 c rice, water Snk (5 PM)-  1 handful pretzels, water D (8:30 PM)-  3/4 chx brst, 3-4 c broc, 1 c rice, water Snk ( PM)-   --- Typical day? Yes.    Intervention: Reviewed recent diet and exercise history, and modified behavioral goals.      For recommendations and goals, see Patient Instructions.    Follow-up: Video visit in 8 weeks.   Winnie Barsky,[name]

## 2022-01-15 ENCOUNTER — Ambulatory Visit (INDEPENDENT_AMBULATORY_CARE_PROVIDER_SITE_OTHER): Payer: Commercial Managed Care - HMO | Admitting: Family Medicine

## 2022-01-15 DIAGNOSIS — Z713 Dietary counseling and surveillance: Secondary | ICD-10-CM

## 2022-01-15 DIAGNOSIS — E669 Obesity, unspecified: Secondary | ICD-10-CM

## 2022-01-15 NOTE — Patient Instructions (Addendum)
-   Pay attention to the TYPES of carbohydrate you eat.  Simple carbohydrates (sugar) as well as even starches that are highly processed may be more likely to promote weight gain.    - How do you define a highly processed food?  If the ingredients are ones you do not recognize and you cannot replicate this "food" in your kitchen, it's probably an ultra-processed food.   Book recommendations:     - Fast Carbs, Slow Carbs by Genene Churn.     - Ultra-processed People by Murriel Hopper  - Pay attention to your hunger levels, and make sure you get enough carbohydrate at meals for satisfaction.   Losing weight too fast can mean losing muscle mass (which lowers metabolic weight, making it increasingly hard to continue to lose.)  - Look over the pdf document provided today about carbohydrate in a healthy diet.  Email Edmonia Lynch if you have questions.     Goals: 1. Include high-quality starches at meals & snacks:    2. Set aside planning time on the weekend to determine grocery list for the week.     3. Exercise at least 60 minutes 3 X wk.  Remember that on days you are pressed for time, five minutes of exercise is five more than zero!     Follow-up video appt on Monday, November 20 at 10 AM.

## 2022-03-12 ENCOUNTER — Ambulatory Visit (INDEPENDENT_AMBULATORY_CARE_PROVIDER_SITE_OTHER): Payer: Commercial Managed Care - HMO | Admitting: Family Medicine

## 2022-03-12 DIAGNOSIS — E669 Obesity, unspecified: Secondary | ICD-10-CM

## 2022-03-12 DIAGNOSIS — Z713 Dietary counseling and surveillance: Secondary | ICD-10-CM

## 2022-03-12 NOTE — Progress Notes (Signed)
Telehealth Encounter PCP Acquanetta Belling, MD Telehealth Encounter for Medical Nutrition Therapy (MNT)  I connected with Beverly Hunter (MRN [id number]) on 03/12/2022 by Caregility video-enabled, HIPAA-compliant telemedicine application, verified that I was speaking with the correct person using two identifiers, and that the patient was in a private environment conducive to confidentiality.  The patient agreed to proceed.  Persons participating in visit were patient and provider (registered dietitian) Linna Darner, PhD, RD, LDN, CEDS.  Provider was located at [facility] during this telehealth encounter; patient was at home.  Appt start time: 1000 end time: 1100 (1 hour)  Reason for telehealth visit: Referred by Acquanetta Belling, MD for Medical Nutrition Therapy related to weight management.  Relevant history/background: Beverly Hunter said she doesn't eat much, but still gains weight easily.  When she restricted to 1200 kcal/day, she lost weight, but regained during the pandemic once she stopped going to the gym, and was eating more while home.    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 She started taking dextro-amphetamine a few weeks ago for her ADHD, which has depressed her appetite.  Most days, she does not remember to eat a mid-day meal.   Weight today: Beverly Hunter is deliberately not weighing herself.  (209 lb on 02/23/21; up from 193 lb on 09/06/20).   Eating pattern: 1-2 meals/day.   Physical activity: Has been trying to just do some calisthenics or a few minutes of walking intermittently during the day.   Sleep:  Estimates 6-7 hrs/night.  Bedtime 1 or 2 AM, but still getting up ~8 AM .  Still taking hydroxyzine nightly.   Advance planning:  Has not been planning very often. 24-hr recall:  (Up at 8 AM) B (8:30 AM)-  1 1/2 c Honey Nut Cheerios, 1 c 2% milk Snk ( AM)-  water L ( PM)-   --- Snk ( PM)-  water D (7:30 PM)-  1 chx & chs sandwich, 10 oz frozen broccoli, water Snk ( PM)-  water Typical day? Yes.   Usually getting veg's once a day.    Intervention: Reviewed recent diet and exercise history, and again modified behavioral goals.      For recommendations and goals, see Patient Instructions.    Follow-up: Video visit in 8 weeks.   Jermone Geister,[name]

## 2022-03-12 NOTE — Patient Instructions (Signed)
FOOD PREFERENCES ARE LEARNED.  This means it will get easier to choose foods you know are good for you (like whole-grain bread vs. White) if you are exposed to them enough.    Goals: 1. Eat at least 3 real meals per day.       - Include high-quality starches at meals & snacks:      - Cereals: Look for at least 5 grams of fiber per serving.  Usually cereals with the word BRAN in the name are good sources of fiber.      - The best quality carb's come with FIBER.     2. Set aside a few minutes planning time weekly to determine:      - Grocery list for the week.       - Exercise days, and what you're doing when, e.g., MWF is Workout #1; TThS is Workout #2, and Sunday is a walk.     3. Exercise: Get at least 10 min of some kind of physical activity every day, using workouts you have designed.       - Remember that on days you are pressed for time, five minutes of exercise is five more than zero!    Document progress on the Goals Sheet provided today.     Follow-up video appt on Monday, January 15 at 11 AM.

## 2022-05-07 ENCOUNTER — Ambulatory Visit (INDEPENDENT_AMBULATORY_CARE_PROVIDER_SITE_OTHER): Payer: Commercial Managed Care - HMO | Admitting: Family Medicine

## 2022-05-07 DIAGNOSIS — Z713 Dietary counseling and surveillance: Secondary | ICD-10-CM

## 2022-05-07 DIAGNOSIS — E669 Obesity, unspecified: Secondary | ICD-10-CM | POA: Diagnosis not present

## 2022-05-07 NOTE — Progress Notes (Signed)
Telehealth Encounter PCP Beverly Morales, MD Telehealth Encounter for Medical Nutrition Therapy (MNT)  I connected with Beverly Hunter (MRN [id number]) on 05/07/2022 by Caregility video-enabled, HIPAA-compliant telemedicine application, verified that I was speaking with the correct person using two identifiers, and that the patient was in a private environment conducive to confidentiality.  The patient agreed to proceed.  Persons participating in visit were patient and provider (registered dietitian) Beverly Center, PhD, RD, LDN, CEDS.  Provider was located at [place] during this telehealth encounter; patient was at home.  Appt start time: 1100 end time: 1200 (1 hour)  Reason for telehealth visit: Referred by Beverly Morales, MD for Medical Nutrition Therapy related to weight management.  Relevant history/background: Beverly Hunter said she doesn't eat much, but still gains weight easily.  When she restricted to 1200 kcal/day, she lost weight, but regained during the pandemic once she stopped going to the gym, and was eating more while home.    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  She started to  document progress on her behavioral goals, but this did not last long.   Weight today: Beverly Hunter is deliberately not weighing herself.  (209 lb on 02/23/21; up from 193 lb on 09/06/20).   Eating pattern: 2 meals/day usually.   Physical activity: light calisthenics most days, i.e., stretches, pushups.  Sleep:  Estimates 7 hrs/night.  Still taking hydroxyzine nightly.   Advance planning:  Has not been planning very often; has realized she has been doing all planning in a digital space, which she doesn't necessarily see regularly.   24-hr recall:  (Up at 8 AM) B (9:30 AM)-  2 eggs, 2 toast, water Snk ( AM)-  --- L (1 PM)-  [place]: 6 oz steak, 1 c broccoli, 1 c mashed potatoes, 10 oz ginger ale Snk ( PM)-  --- D (8 PM)-  1 deli chx sandwich, cheese,  water Snk ( PM)-  --- Typical day? No. Not usually getting 3 meals, and normally eats restaurant food only seldom.    Intervention: Reviewed recent diet and exercise history, and slightly modified goals.    For recommendations and goals, see Patient Instructions.    Follow-up: Video visit in TBA.  Beverly Hunter will email when she knows her schedule (when she'll be getting her wisdom teeth extracted).    Beverly Hunter,Beverly Hunter

## 2022-05-07 NOTE — Patient Instructions (Signed)
Cereals:   - Choose ones with at least 5 grams of fiber per serving.  Try mixing some high-fiber cereal with any lower-fiber cereal you like.  Check out: https://thegeriatricdietitian.com/cereals-that-are-high-in-fiber/#:~:text=The%20Ultimate%20List%20of%20Cereals%20that%20are%20High,%20%2013%20%2040%41more%20rows%20 - Another way to boost the fiber content of cereal is to add flax seed meal to your cereal.  Start with only 1 teaspoon, and increase slowly up to 1 heaping tablespoon.  (Store flax in the refrigerator.)  Behavioral Goals: 1. Set aside a few minutes planning time each Wednesday or Sunday to determine:      - Grocery list for the week.       - Exercise days, and what workout, e.g., MWF is Workout #1; TThS is Workout #2, and Sunday is a walk.       - Use a white board for weekly exercise schedule as well as meals (table form).  2. Eat at least 3 real meals per day.       - Set kitchen timer as meals reminder.       - A REAL breakfast includes at least a source of protein and starch.      - A REAL lunch or dinner includes at least a source of protein, starch, and vegetables.      - Remember:  The best quality carb's come with FIBER.   3. Exercise: Get at least 10 min of some kind of physical activity every day, using workouts you have designed.       - Track workouts on Apple watch (and # of minutes on Goals Sheet).      - Remember that on days you are pressed for time, five minutes of exercise is five more than zero!    Document progress on the your Goals Sheet. Keep your Goals Sheet where you see it daily.     Follow-up video appt:  Call/email when you are ready to schedule.

## 2022-10-24 DIAGNOSIS — F411 Generalized anxiety disorder: Secondary | ICD-10-CM | POA: Diagnosis not present

## 2022-10-24 DIAGNOSIS — F900 Attention-deficit hyperactivity disorder, predominantly inattentive type: Secondary | ICD-10-CM | POA: Diagnosis not present

## 2022-10-24 DIAGNOSIS — F331 Major depressive disorder, recurrent, moderate: Secondary | ICD-10-CM | POA: Diagnosis not present

## 2022-10-30 DIAGNOSIS — F900 Attention-deficit hyperactivity disorder, predominantly inattentive type: Secondary | ICD-10-CM | POA: Diagnosis not present

## 2022-11-01 DIAGNOSIS — F900 Attention-deficit hyperactivity disorder, predominantly inattentive type: Secondary | ICD-10-CM | POA: Diagnosis not present

## 2022-11-01 DIAGNOSIS — F411 Generalized anxiety disorder: Secondary | ICD-10-CM | POA: Diagnosis not present

## 2022-11-01 DIAGNOSIS — F331 Major depressive disorder, recurrent, moderate: Secondary | ICD-10-CM | POA: Diagnosis not present

## 2022-11-15 DIAGNOSIS — F411 Generalized anxiety disorder: Secondary | ICD-10-CM | POA: Diagnosis not present

## 2022-11-15 DIAGNOSIS — F331 Major depressive disorder, recurrent, moderate: Secondary | ICD-10-CM | POA: Diagnosis not present

## 2022-11-15 DIAGNOSIS — F900 Attention-deficit hyperactivity disorder, predominantly inattentive type: Secondary | ICD-10-CM | POA: Diagnosis not present

## 2022-12-07 DIAGNOSIS — F411 Generalized anxiety disorder: Secondary | ICD-10-CM | POA: Diagnosis not present

## 2022-12-07 DIAGNOSIS — F900 Attention-deficit hyperactivity disorder, predominantly inattentive type: Secondary | ICD-10-CM | POA: Diagnosis not present

## 2022-12-07 DIAGNOSIS — F331 Major depressive disorder, recurrent, moderate: Secondary | ICD-10-CM | POA: Diagnosis not present

## 2022-12-11 DIAGNOSIS — F411 Generalized anxiety disorder: Secondary | ICD-10-CM | POA: Diagnosis not present

## 2022-12-25 DIAGNOSIS — F331 Major depressive disorder, recurrent, moderate: Secondary | ICD-10-CM | POA: Diagnosis not present

## 2022-12-31 DIAGNOSIS — F900 Attention-deficit hyperactivity disorder, predominantly inattentive type: Secondary | ICD-10-CM | POA: Diagnosis not present

## 2022-12-31 DIAGNOSIS — F411 Generalized anxiety disorder: Secondary | ICD-10-CM | POA: Diagnosis not present

## 2022-12-31 DIAGNOSIS — F331 Major depressive disorder, recurrent, moderate: Secondary | ICD-10-CM | POA: Diagnosis not present

## 2023-01-07 DIAGNOSIS — F331 Major depressive disorder, recurrent, moderate: Secondary | ICD-10-CM | POA: Diagnosis not present

## 2023-01-24 ENCOUNTER — Other Ambulatory Visit (HOSPITAL_COMMUNITY): Payer: Self-pay

## 2023-01-24 DIAGNOSIS — F900 Attention-deficit hyperactivity disorder, predominantly inattentive type: Secondary | ICD-10-CM | POA: Diagnosis not present

## 2023-01-24 DIAGNOSIS — F411 Generalized anxiety disorder: Secondary | ICD-10-CM | POA: Diagnosis not present

## 2023-01-24 DIAGNOSIS — F331 Major depressive disorder, recurrent, moderate: Secondary | ICD-10-CM | POA: Diagnosis not present

## 2023-01-24 MED ORDER — AMPHETAMINE-DEXTROAMPHET ER 20 MG PO CP24
20.0000 mg | ORAL_CAPSULE | Freq: Every morning | ORAL | 0 refills | Status: DC
  Filled 2023-01-24: qty 30, 30d supply, fill #0

## 2023-02-01 ENCOUNTER — Other Ambulatory Visit (HOSPITAL_COMMUNITY): Payer: Self-pay

## 2023-02-07 ENCOUNTER — Other Ambulatory Visit (HOSPITAL_COMMUNITY): Payer: Self-pay

## 2023-02-07 DIAGNOSIS — F900 Attention-deficit hyperactivity disorder, predominantly inattentive type: Secondary | ICD-10-CM | POA: Diagnosis not present

## 2023-02-07 DIAGNOSIS — F411 Generalized anxiety disorder: Secondary | ICD-10-CM | POA: Diagnosis not present

## 2023-02-07 DIAGNOSIS — F331 Major depressive disorder, recurrent, moderate: Secondary | ICD-10-CM | POA: Diagnosis not present

## 2023-02-07 MED ORDER — HYDROXYZINE HCL 25 MG PO TABS
25.0000 mg | ORAL_TABLET | Freq: Two times a day (BID) | ORAL | 0 refills | Status: DC | PRN
  Filled 2023-02-07 – 2023-02-21 (×2): qty 60, 30d supply, fill #0

## 2023-02-07 MED ORDER — AMPHETAMINE-DEXTROAMPHET ER 20 MG PO CP24: 20.0000 mg | ORAL_CAPSULE | Freq: Every morning | ORAL | 0 refills | Status: DC

## 2023-02-07 MED ORDER — VENLAFAXINE HCL 75 MG PO TABS
75.0000 mg | ORAL_TABLET | Freq: Every day | ORAL | 0 refills | Status: AC
  Filled 2023-02-07: qty 90, 90d supply, fill #0

## 2023-02-16 ENCOUNTER — Other Ambulatory Visit (HOSPITAL_COMMUNITY): Payer: Self-pay

## 2023-02-21 ENCOUNTER — Other Ambulatory Visit (HOSPITAL_COMMUNITY): Payer: Self-pay

## 2023-02-21 DIAGNOSIS — F900 Attention-deficit hyperactivity disorder, predominantly inattentive type: Secondary | ICD-10-CM | POA: Diagnosis not present

## 2023-02-21 DIAGNOSIS — F331 Major depressive disorder, recurrent, moderate: Secondary | ICD-10-CM | POA: Diagnosis not present

## 2023-02-21 DIAGNOSIS — F411 Generalized anxiety disorder: Secondary | ICD-10-CM | POA: Diagnosis not present

## 2023-03-07 DIAGNOSIS — F331 Major depressive disorder, recurrent, moderate: Secondary | ICD-10-CM | POA: Diagnosis not present

## 2023-03-07 DIAGNOSIS — F411 Generalized anxiety disorder: Secondary | ICD-10-CM | POA: Diagnosis not present

## 2023-03-07 DIAGNOSIS — F900 Attention-deficit hyperactivity disorder, predominantly inattentive type: Secondary | ICD-10-CM | POA: Diagnosis not present

## 2023-03-08 ENCOUNTER — Other Ambulatory Visit: Payer: Self-pay

## 2023-03-08 ENCOUNTER — Other Ambulatory Visit (HOSPITAL_COMMUNITY): Payer: Self-pay

## 2023-03-08 MED ORDER — HYDROXYZINE HCL 25 MG PO TABS
25.0000 mg | ORAL_TABLET | Freq: Two times a day (BID) | ORAL | 0 refills | Status: DC | PRN
  Filled 2023-03-08 – 2023-03-14 (×2): qty 60, 15d supply, fill #0

## 2023-03-08 MED ORDER — VENLAFAXINE HCL ER 150 MG PO CP24
150.0000 mg | ORAL_CAPSULE | Freq: Every day | ORAL | 0 refills | Status: DC
  Filled 2023-03-08: qty 15, 15d supply, fill #0

## 2023-03-08 MED ORDER — AMPHETAMINE-DEXTROAMPHET ER 20 MG PO CP24
20.0000 mg | ORAL_CAPSULE | Freq: Every morning | ORAL | 0 refills | Status: DC
  Filled 2023-03-08: qty 30, 30d supply, fill #0

## 2023-03-14 ENCOUNTER — Other Ambulatory Visit (HOSPITAL_COMMUNITY): Payer: Self-pay

## 2023-03-20 ENCOUNTER — Other Ambulatory Visit (HOSPITAL_COMMUNITY): Payer: Self-pay

## 2023-03-20 DIAGNOSIS — F411 Generalized anxiety disorder: Secondary | ICD-10-CM | POA: Diagnosis not present

## 2023-03-20 DIAGNOSIS — F900 Attention-deficit hyperactivity disorder, predominantly inattentive type: Secondary | ICD-10-CM | POA: Diagnosis not present

## 2023-03-20 DIAGNOSIS — F331 Major depressive disorder, recurrent, moderate: Secondary | ICD-10-CM | POA: Diagnosis not present

## 2023-03-20 MED ORDER — HYDROXYZINE HCL 25 MG PO TABS
25.0000 mg | ORAL_TABLET | Freq: Two times a day (BID) | ORAL | 0 refills | Status: DC | PRN
  Filled 2023-03-20: qty 60, 15d supply, fill #0

## 2023-03-20 MED ORDER — AMPHETAMINE-DEXTROAMPHET ER 20 MG PO CP24: 20.0000 mg | ORAL_CAPSULE | Freq: Every morning | ORAL | 0 refills | Status: DC

## 2023-03-20 MED ORDER — VENLAFAXINE HCL ER 150 MG PO CP24
150.0000 mg | ORAL_CAPSULE | Freq: Every day | ORAL | 0 refills | Status: DC
  Filled 2023-03-20: qty 30, 30d supply, fill #0

## 2023-04-11 ENCOUNTER — Other Ambulatory Visit (HOSPITAL_COMMUNITY): Payer: Self-pay

## 2023-04-11 DIAGNOSIS — F900 Attention-deficit hyperactivity disorder, predominantly inattentive type: Secondary | ICD-10-CM | POA: Diagnosis not present

## 2023-04-11 DIAGNOSIS — F331 Major depressive disorder, recurrent, moderate: Secondary | ICD-10-CM | POA: Diagnosis not present

## 2023-04-11 DIAGNOSIS — F411 Generalized anxiety disorder: Secondary | ICD-10-CM | POA: Diagnosis not present

## 2023-04-11 MED ORDER — VENLAFAXINE HCL ER 150 MG PO CP24
150.0000 mg | ORAL_CAPSULE | Freq: Every day | ORAL | 0 refills | Status: DC
  Filled 2023-04-11: qty 30, 30d supply, fill #0

## 2023-04-11 MED ORDER — HYDROXYZINE HCL 25 MG PO TABS
25.0000 mg | ORAL_TABLET | Freq: Two times a day (BID) | ORAL | 0 refills | Status: DC | PRN
  Filled 2023-04-11: qty 60, 15d supply, fill #0

## 2023-04-11 MED ORDER — AMPHETAMINE-DEXTROAMPHET ER 20 MG PO CP24
20.0000 mg | ORAL_CAPSULE | Freq: Every morning | ORAL | 0 refills | Status: DC
  Filled 2023-04-11: qty 30, 30d supply, fill #0

## 2023-04-18 ENCOUNTER — Other Ambulatory Visit: Payer: Self-pay | Admitting: Family Medicine

## 2023-04-18 DIAGNOSIS — J3089 Other allergic rhinitis: Secondary | ICD-10-CM

## 2023-04-18 MED ORDER — MOMETASONE FUROATE 50 MCG/ACT NA SUSP: 2.0000 | Freq: Every day | NASAL | 12 refills | Status: DC

## 2023-04-18 NOTE — Progress Notes (Unsigned)
Beverly Hunter is {Pc accompanied by:5710} Sources of clinical information for visit is/are {Information source:60032}. Nursing assessment for this office visit was reviewed with the patient for accuracy and revision.     Previous Report(s) Reviewed: {Outside review:15817}     02/23/2021   10:28 AM  Depression screen PHQ 2/9  Decreased Interest 1  Down, Depressed, Hopeless 1  PHQ - 2 Score 2  Altered sleeping 0  Tired, decreased energy 1  Change in appetite 0  Feeling bad or failure about yourself  2  Trouble concentrating 1  Moving slowly or fidgety/restless 0  Suicidal thoughts 0  PHQ-9 Score 6  Difficult doing work/chores Somewhat difficult   Flowsheet Row Office Visit from 02/23/2021 in [facility] [place]. [facility] Office Visit from 06/08/2020 in Center for [facility] Office Visit from 04/21/2020 in [facility] - [facility] [place]. [facility]  Thoughts that you would be better off dead, or of hurting yourself in some way Not at all Not at all Not at all  PHQ-9 Total Score 6 5 14            No data to display             02/23/2021   10:28 AM 06/08/2020    1:56 PM 04/21/2020    1:48 PM  PHQ9 SCORE ONLY  PHQ-9 Total Score 6 5 14     There are no preventive care reminders to display for this patient.  Health Maintenance Due  Topic Date Due   INFLUENZA VACCINE  11/22/2022   COVID-19 Vaccine (5 - 2024-25 season) 12/23/2022   Cervical Cancer Screening (Pap smear)  06/09/2023      History/P.E. limitations: {exam; limitations ed:60112}  There are no preventive care reminders to display for this patient. There are no preventive care reminders to display for this patient.  Health Maintenance Due  Topic Date Due   INFLUENZA VACCINE  11/22/2022   COVID-19 Vaccine (5 - 2024-25 season) 12/23/2022   Cervical Cancer Screening (Pap smear)  06/09/2023     No  chief complaint on file.    --------------------------------------------------------------------------------------------------------------------------------------------- Visit Problem List with A/P  No problem-specific Assessment & Plan notes found for this encounter.

## 2023-04-22 ENCOUNTER — Other Ambulatory Visit (HOSPITAL_COMMUNITY): Payer: Self-pay

## 2023-05-09 DIAGNOSIS — F900 Attention-deficit hyperactivity disorder, predominantly inattentive type: Secondary | ICD-10-CM | POA: Diagnosis not present

## 2023-05-09 DIAGNOSIS — F411 Generalized anxiety disorder: Secondary | ICD-10-CM | POA: Diagnosis not present

## 2023-05-09 DIAGNOSIS — F331 Major depressive disorder, recurrent, moderate: Secondary | ICD-10-CM | POA: Diagnosis not present

## 2023-05-10 ENCOUNTER — Other Ambulatory Visit (HOSPITAL_COMMUNITY): Payer: Self-pay

## 2023-05-10 MED ORDER — HYDROXYZINE HCL 25 MG PO TABS
25.0000 mg | ORAL_TABLET | Freq: Two times a day (BID) | ORAL | 0 refills | Status: AC | PRN
  Filled 2023-05-10: qty 120, 30d supply, fill #0

## 2023-05-10 MED ORDER — AMPHETAMINE-DEXTROAMPHET ER 20 MG PO CP24
20.0000 mg | ORAL_CAPSULE | Freq: Every morning | ORAL | 0 refills | Status: DC
  Filled 2023-06-03: qty 30, 30d supply, fill #0

## 2023-05-10 MED ORDER — AMPHETAMINE-DEXTROAMPHET ER 20 MG PO CP24
20.0000 mg | ORAL_CAPSULE | Freq: Every morning | ORAL | 0 refills | Status: AC
  Filled 2023-07-10: qty 30, 30d supply, fill #0

## 2023-05-10 MED ORDER — VENLAFAXINE HCL ER 150 MG PO CP24
150.0000 mg | ORAL_CAPSULE | Freq: Every day | ORAL | 0 refills | Status: DC
  Filled 2023-05-10 – 2023-06-03 (×2): qty 60, 60d supply, fill #0

## 2023-05-14 ENCOUNTER — Other Ambulatory Visit (HOSPITAL_COMMUNITY): Payer: Self-pay

## 2023-05-20 ENCOUNTER — Other Ambulatory Visit (HOSPITAL_COMMUNITY): Payer: Self-pay

## 2023-06-03 ENCOUNTER — Other Ambulatory Visit (HOSPITAL_COMMUNITY): Payer: Self-pay

## 2023-07-08 NOTE — Progress Notes (Unsigned)
 Beverly Hunter is {Pc accompanied by:5710} Sources of clinical information for visit is/are {Information source:60032}. Nursing assessment for this office visit was reviewed with the patient for accuracy and revision.     Previous Report(s) Reviewed: {Outside review:15817}     02/23/2021   10:28 AM  Depression screen PHQ 2/9  Decreased Interest 1  Down, Depressed, Hopeless 1  PHQ - 2 Score 2  Altered sleeping 0  Tired, decreased energy 1  Change in appetite 0  Feeling bad or failure about yourself  2  Trouble concentrating 1  Moving slowly or fidgety/restless 0  Suicidal thoughts 0  PHQ-9 Score 6  Difficult doing work/chores Somewhat difficult   Flowsheet Row Office Visit from 02/23/2021 in [facility] [place]. [facility] Office Visit from 06/08/2020 in [facility] for [facility] Office Visit from 04/21/2020 in [facility] [place]. [facility]  Thoughts that you would be better off dead, or of hurting yourself in some way Not at all Not at all Not at all  PHQ-9 Total Score 6 5 14            No data to display             02/23/2021   10:28 AM 06/08/2020    1:56 PM 04/21/2020    1:48 PM  PHQ9 SCORE ONLY  PHQ-9 Total Score 6 5 14     There are no preventive care reminders to display for this patient.  Health Maintenance Due  Topic Date Due   INFLUENZA VACCINE  11/22/2022   COVID-19 Vaccine (5 - 2024-25 season) 12/23/2022   Cervical Cancer Screening (Pap smear)  06/09/2023      History/P.E. limitations: {exam; limitations ed:60112}  There are no preventive care reminders to display for this patient. There are no preventive care reminders to display for this patient.  Health Maintenance Due  Topic Date Due   INFLUENZA VACCINE  11/22/2022   COVID-19 Vaccine (5 - 2024-25 season) 12/23/2022   Cervical Cancer Screening (Pap smear)  06/09/2023     No  chief complaint on file.    --------------------------------------------------------------------------------------------------------------------------------------------- Visit Problem List with A/P  No problem-specific Assessment & Plan notes found for this encounter.

## 2023-07-09 ENCOUNTER — Other Ambulatory Visit (HOSPITAL_COMMUNITY): Payer: Self-pay

## 2023-07-09 ENCOUNTER — Ambulatory Visit (INDEPENDENT_AMBULATORY_CARE_PROVIDER_SITE_OTHER): Admitting: Family Medicine

## 2023-07-09 VITALS — BP 128/80 | HR 95 | Wt 230.0 lb

## 2023-07-09 DIAGNOSIS — J3089 Other allergic rhinitis: Secondary | ICD-10-CM | POA: Diagnosis not present

## 2023-07-09 MED ORDER — AZELASTINE HCL 0.1 % NA SOLN
2.0000 | Freq: Two times a day (BID) | NASAL | 1 refills | Status: AC
  Filled 2023-07-09: qty 30, 50d supply, fill #0

## 2023-07-09 NOTE — Patient Instructions (Addendum)
 There is an area of irritation over the left side of your nasal septum that is likely a source of nose bleeds.   To heal the septal irritation site, apply a normal saline gel for to 5 times a day to help your body heal the irritation and inflammation.  Normal Saline gels are available over the counter.  One I know of is called AYR saline gel.    After using the gel for 2 weeks, start a the nasal spray, Astelin (azelastine) nasal spray, two sprays into each nostril twice a day, every day until your next office visit with Beverly Hunter.

## 2023-07-10 ENCOUNTER — Encounter: Payer: Self-pay | Admitting: Family Medicine

## 2023-07-10 ENCOUNTER — Other Ambulatory Visit (HOSPITAL_COMMUNITY): Payer: Self-pay

## 2023-07-10 NOTE — Assessment & Plan Note (Signed)
 Established problem worsened.  Patient is not at goal of symptom control (+) epistaxis Nasal congestion, pressure sinus, runny nose, PND(+), sneeze, (+) itchy throat,  Tried multiple OTC antihistamines without help.   Smkoe and pollen makes worse.  Itiching skin if brushes against surface covered with pollen.  Cold air makes breathing hearder.  No formal diagnosis of asthma nor eczema.  Prescribed momentasone never picked up   PE Area of erythematous anterior septal mucosa in left nare.  TMs translucent No cervical LAN Lungs BCTA Skin: no rashes  Start Nasal saline gel 5 times a day to left anterior septum for 10 days  Then start azelastine NS after treatment of septal sore.  RTC 6 weeks to assess response.

## 2023-07-15 ENCOUNTER — Other Ambulatory Visit: Payer: Self-pay

## 2023-07-15 ENCOUNTER — Other Ambulatory Visit (HOSPITAL_COMMUNITY): Payer: Self-pay

## 2023-07-15 DIAGNOSIS — F331 Major depressive disorder, recurrent, moderate: Secondary | ICD-10-CM | POA: Diagnosis not present

## 2023-07-15 DIAGNOSIS — F900 Attention-deficit hyperactivity disorder, predominantly inattentive type: Secondary | ICD-10-CM | POA: Diagnosis not present

## 2023-07-15 DIAGNOSIS — F411 Generalized anxiety disorder: Secondary | ICD-10-CM | POA: Diagnosis not present

## 2023-07-15 MED ORDER — VENLAFAXINE HCL ER 150 MG PO CP24
150.0000 mg | ORAL_CAPSULE | Freq: Every day | ORAL | 0 refills | Status: AC
  Filled 2023-08-15: qty 90, 90d supply, fill #0

## 2023-07-15 MED ORDER — AMPHETAMINE-DEXTROAMPHET ER 20 MG PO CP24
20.0000 mg | ORAL_CAPSULE | Freq: Every morning | ORAL | 0 refills | Status: AC
  Filled 2023-10-08: qty 30, 30d supply, fill #0

## 2023-07-15 MED ORDER — AMPHETAMINE-DEXTROAMPHET ER 20 MG PO CP24
20.0000 mg | ORAL_CAPSULE | Freq: Every morning | ORAL | 0 refills | Status: AC
  Filled 2023-08-15: qty 30, 30d supply, fill #0

## 2023-07-15 MED ORDER — HYDROXYZINE HCL 25 MG PO TABS
25.0000 mg | ORAL_TABLET | Freq: Two times a day (BID) | ORAL | 0 refills | Status: AC | PRN
  Filled 2023-07-15: qty 120, 30d supply, fill #0

## 2023-07-18 ENCOUNTER — Encounter: Payer: Commercial Managed Care - HMO | Admitting: Family Medicine

## 2023-07-24 ENCOUNTER — Other Ambulatory Visit (HOSPITAL_COMMUNITY): Payer: Self-pay

## 2023-08-15 ENCOUNTER — Other Ambulatory Visit (HOSPITAL_COMMUNITY): Payer: Self-pay

## 2023-10-08 ENCOUNTER — Other Ambulatory Visit (HOSPITAL_COMMUNITY): Payer: Self-pay

## 2023-10-09 ENCOUNTER — Other Ambulatory Visit (HOSPITAL_COMMUNITY): Payer: Self-pay

## 2023-11-08 ENCOUNTER — Encounter: Payer: Self-pay | Admitting: Advanced Practice Midwife
